# Patient Record
Sex: Male | Born: 1990 | Race: White | Hispanic: No | Marital: Single
Health system: Midwestern US, Academic
[De-identification: ages and names within clinical notes are randomized; demographics above are authoritative.]

---

## 2017-08-11 ENCOUNTER — Emergency Department: Admit: 2017-08-11 | Discharge: 2017-08-11 | Attending: Primary Care

## 2017-08-11 ENCOUNTER — Emergency Department
Admit: 2017-08-11 | Discharge: 2017-08-11 | Disposition: A | Discharge status: Home / Self Care | Attending: Primary Care

## 2017-08-11 DIAGNOSIS — R0789 Other chest pain: ICD-10-CM

## 2017-08-11 DIAGNOSIS — M940 Chondrocostal junction syndrome [Tietze]: Principal | ICD-10-CM

## 2017-08-11 DIAGNOSIS — K219 Gastro-esophageal reflux disease without esophagitis: ICD-10-CM

## 2017-08-11 LAB — POC TROPONIN
Lab: 0 ng/mL (ref 0.00–0.05)
Lab: 0 ng/mL (ref 0.00–0.05)

## 2017-08-11 LAB — URINALYSIS, MICROSCOPIC

## 2017-08-11 LAB — CBC AND DIFF: Lab: 7.7 10*3/uL (ref 4.5–11.0)

## 2017-08-11 LAB — COMPREHENSIVE METABOLIC PANEL: Lab: 139 MMOL/L (ref 137–147)

## 2017-08-11 LAB — URINALYSIS DIPSTICK
Lab: NEGATIVE g/dL (ref 3.5–5.0)
Lab: NEGATIVE mg/dL (ref 0.3–1.2)

## 2017-08-11 LAB — LIPASE: Lab: 19 U/L (ref 11–82)

## 2017-08-11 MED ORDER — KETOROLAC 15 MG/ML IJ SOLN
15 mg | Freq: Once | INTRAVENOUS | 0 refills | Status: CP
Start: 2017-08-11 — End: ?
  Administered 2017-08-11: 17:00:00 15 mg via INTRAVENOUS

## 2017-08-11 MED ORDER — METHOCARBAMOL 500 MG PO TAB
1000 mg | Freq: Once | ORAL | 0 refills | Status: CP
Start: 2017-08-11 — End: ?
  Administered 2017-08-11: 18:00:00 1000 mg via ORAL

## 2017-08-11 MED ORDER — FAMOTIDINE (PF) 20 MG/2 ML IV SOLN
20 mg | Freq: Once | INTRAVENOUS | 0 refills | Status: CP
Start: 2017-08-11 — End: ?
  Administered 2017-08-11: 17:00:00 20 mg via INTRAVENOUS

## 2017-08-11 MED ORDER — PREDNISONE 20 MG PO TAB
ORAL_TABLET | 0 refills | Status: AC
Start: 2017-08-11 — End: 2018-01-06

## 2017-08-11 MED ORDER — SODIUM CHLORIDE 0.9 % IV SOLP
1000 mL | INTRAVENOUS | 0 refills | Status: CP
Start: 2017-08-11 — End: ?
  Administered 2017-08-11: 16:00:00 1000 mL via INTRAVENOUS

## 2017-08-11 MED ORDER — HYDROCODONE-ACETAMINOPHEN 5-325 MG PO TAB
2 | Freq: Once | ORAL | 0 refills | Status: CP
Start: 2017-08-11 — End: ?
  Administered 2017-08-11: 18:00:00 2 via ORAL

## 2017-08-11 MED ORDER — CYCLOBENZAPRINE 10 MG PO TAB
10 mg | ORAL_TABLET | Freq: Three times a day (TID) | ORAL | 0 refills | 21.00000 days | Status: AC | PRN
Start: 2017-08-11 — End: 2018-01-06

## 2017-08-11 MED ORDER — IMS MIXTURE TEMPLATE
50 mg | Freq: Once | ORAL | 0 refills | Status: CP
Start: 2017-08-11 — End: ?
  Administered 2017-08-11 (×2): 50 mg via ORAL

## 2017-08-11 MED ORDER — OMEPRAZOLE 20 MG PO TBEC
20 mg | ORAL_TABLET | Freq: Every day | ORAL | 0 refills | Status: AC
Start: 2017-08-11 — End: 2018-01-06

## 2017-08-11 MED ORDER — OMEPRAZOLE 20 MG PO TBEC
20 mg | ORAL_TABLET | Freq: Every day | ORAL | 5 refills | Status: AC
Start: 2017-08-11 — End: 2017-08-11

## 2017-08-11 MED ORDER — ACETAMINOPHEN/LIDOCAINE/ANTACID DS(#) 1:1:3  PO SUSP
30 mL | Freq: Once | ORAL | 0 refills | Status: CP
Start: 2017-08-11 — End: ?
  Administered 2017-08-11: 16:00:00 30 mL via ORAL

## 2017-08-11 MED ORDER — NAPROXEN 500 MG PO TAB
500 mg | ORAL_TABLET | Freq: Two times a day (BID) | ORAL | 0 refills | Status: AC
Start: 2017-08-11 — End: 2018-02-04

## 2017-09-24 ENCOUNTER — Encounter: Admit: 2017-09-24 | Discharge: 2017-09-25

## 2017-12-04 ENCOUNTER — Encounter: Admit: 2017-12-04 | Discharge: 2017-12-04 | Payer: Commercial Managed Care - PPO

## 2017-12-05 ENCOUNTER — Encounter: Admit: 2017-12-05 | Discharge: 2017-12-05 | Payer: PRIVATE HEALTH INSURANCE

## 2017-12-10 ENCOUNTER — Encounter: Admit: 2017-12-10 | Discharge: 2017-12-10 | Payer: Commercial Managed Care - PPO

## 2017-12-11 ENCOUNTER — Ambulatory Visit: Admit: 2017-12-10 | Discharge: 2017-12-11 | Payer: PRIVATE HEALTH INSURANCE

## 2017-12-11 DIAGNOSIS — K4090 Unilateral inguinal hernia, without obstruction or gangrene, not specified as recurrent: Principal | ICD-10-CM

## 2018-01-06 ENCOUNTER — Encounter: Admit: 2018-01-06 | Discharge: 2018-01-06 | Payer: Commercial Managed Care - PPO

## 2018-01-06 ENCOUNTER — Encounter: Admit: 2018-01-06 | Discharge: 2018-01-06 | Payer: PRIVATE HEALTH INSURANCE

## 2018-01-06 DIAGNOSIS — K4090 Unilateral inguinal hernia, without obstruction or gangrene, not specified as recurrent: Principal | ICD-10-CM

## 2018-01-06 MED ORDER — CEFAZOLIN INJ 1GM IVP
2 g | Freq: Once | INTRAVENOUS | 0 refills | Status: CN
Start: 2018-01-06 — End: ?

## 2018-01-09 ENCOUNTER — Encounter: Admit: 2018-01-09 | Discharge: 2018-01-09 | Payer: PRIVATE HEALTH INSURANCE

## 2018-01-09 ENCOUNTER — Ambulatory Visit: Admit: 2018-01-09 | Discharge: 2018-01-09 | Payer: Commercial Managed Care - PPO

## 2018-01-09 ENCOUNTER — Encounter: Admit: 2018-01-09 | Discharge: 2018-01-09 | Payer: Commercial Managed Care - PPO

## 2018-01-09 ENCOUNTER — Ambulatory Visit: Admit: 2018-01-09 | Discharge: 2018-01-09

## 2018-01-09 ENCOUNTER — Ambulatory Visit: Admit: 2018-01-09 | Discharge: 2018-01-09 | Payer: PRIVATE HEALTH INSURANCE

## 2018-01-09 DIAGNOSIS — K4090 Unilateral inguinal hernia, without obstruction or gangrene, not specified as recurrent: Principal | ICD-10-CM

## 2018-01-09 MED ORDER — OXYCODONE 5 MG PO TAB
5-10 mg | Freq: Once | ORAL | 0 refills | Status: CP | PRN
Start: 2018-01-09 — End: ?
  Administered 2018-01-09: 17:00:00 5 mg via ORAL

## 2018-01-09 MED ORDER — LIDOCAINE HCL 10 MG/ML (1 %) IJ SOLN
0 refills | Status: DC
Start: 2018-01-09 — End: 2018-01-09
  Administered 2018-01-09: 16:00:00 5 mL via INTRAMUSCULAR

## 2018-01-09 MED ORDER — DIPHENHYDRAMINE HCL 50 MG/ML IJ SOLN
25 mg | Freq: Once | INTRAVENOUS | 0 refills | Status: DC | PRN
Start: 2018-01-09 — End: 2018-01-09

## 2018-01-09 MED ORDER — DEXAMETHASONE SODIUM PHOSPHATE 4 MG/ML IJ SOLN
INTRAVENOUS | 0 refills | Status: DC
Start: 2018-01-09 — End: 2018-01-09
  Administered 2018-01-09: 16:00:00 4 mg via INTRAVENOUS

## 2018-01-09 MED ORDER — CEFAZOLIN INJ 1GM IVP
2 g | Freq: Once | INTRAVENOUS | 0 refills | Status: CP
Start: 2018-01-09 — End: ?
  Administered 2018-01-09: 16:00:00 2 g via INTRAVENOUS

## 2018-01-09 MED ORDER — PROPOFOL INJ 10 MG/ML IV VIAL
0 refills | Status: DC
Start: 2018-01-09 — End: 2018-01-09
  Administered 2018-01-09: 16:00:00 200 mg via INTRAVENOUS

## 2018-01-09 MED ORDER — LACTATED RINGERS IV SOLP
INTRAVENOUS | 0 refills | Status: DC
Start: 2018-01-09 — End: 2018-01-09
  Administered 2018-01-09: 15:00:00 1000.000 mL via INTRAVENOUS

## 2018-01-09 MED ORDER — ONDANSETRON HCL (PF) 4 MG/2 ML IJ SOLN
INTRAVENOUS | 0 refills | Status: DC
Start: 2018-01-09 — End: 2018-01-09
  Administered 2018-01-09: 16:00:00 4 mg via INTRAVENOUS

## 2018-01-09 MED ORDER — MEPERIDINE (PF) 25 MG/ML IJ SYRG
12.5 mg | INTRAVENOUS | 0 refills | Status: DC | PRN
Start: 2018-01-09 — End: 2018-01-09
  Administered 2018-01-09: 17:00:00 12.5 mg via INTRAVENOUS

## 2018-01-09 MED ORDER — LIDOCAINE (PF) 10 MG/ML (1 %) IJ SOLN
.1-2 mL | INTRAMUSCULAR | 0 refills | Status: DC | PRN
Start: 2018-01-09 — End: 2018-01-09

## 2018-01-09 MED ORDER — BUPIVACAINE 0.25 % (2.5 MG/ML) IJ SOLN
0 refills | Status: DC
Start: 2018-01-09 — End: 2018-01-09
  Administered 2018-01-09: 16:00:00 5 mL via INTRAMUSCULAR

## 2018-01-09 MED ORDER — ONDANSETRON HCL (PF) 4 MG/2 ML IJ SOLN
4 mg | Freq: Once | INTRAVENOUS | 0 refills | Status: DC | PRN
Start: 2018-01-09 — End: 2018-01-09

## 2018-01-09 MED ORDER — FENTANYL CITRATE (PF) 50 MCG/ML IJ SOLN
50 ug | INTRAVENOUS | 0 refills | Status: DC | PRN
Start: 2018-01-09 — End: 2018-01-09
  Administered 2018-01-09: 17:00:00 50 ug via INTRAVENOUS

## 2018-01-09 MED ORDER — HYDROCODONE-ACETAMINOPHEN 5-325 MG PO TAB
1 | ORAL_TABLET | ORAL | 0 refills | 15.00000 days | Status: AC | PRN
Start: 2018-01-09 — End: 2018-01-21
  Filled 2018-01-09 (×2): qty 30, 5d supply, fill #1

## 2018-01-09 MED ORDER — LIDOCAINE (PF) 200 MG/10 ML (2 %) IJ SYRG
0 refills | Status: DC
Start: 2018-01-09 — End: 2018-01-09
  Administered 2018-01-09: 16:00:00 60 mg via INTRAVENOUS

## 2018-01-09 MED ORDER — HALOPERIDOL LACTATE 5 MG/ML IJ SOLN
1 mg | Freq: Once | INTRAVENOUS | 0 refills | Status: DC | PRN
Start: 2018-01-09 — End: 2018-01-09

## 2018-01-09 MED ORDER — FENTANYL CITRATE (PF) 50 MCG/ML IJ SOLN
0 refills | Status: DC
Start: 2018-01-09 — End: 2018-01-09
  Administered 2018-01-09: 16:00:00 100 ug via INTRAVENOUS

## 2018-01-09 MED ORDER — FENTANYL CITRATE (PF) 50 MCG/ML IJ SOLN
25 ug | INTRAVENOUS | 0 refills | Status: DC | PRN
Start: 2018-01-09 — End: 2018-01-09

## 2018-01-09 MED ORDER — MIDAZOLAM 1 MG/ML IJ SOLN
INTRAVENOUS | 0 refills | Status: DC
Start: 2018-01-09 — End: 2018-01-09
  Administered 2018-01-09: 16:00:00 2 mg via INTRAVENOUS

## 2018-01-11 ENCOUNTER — Encounter: Admit: 2018-01-11 | Discharge: 2018-01-11

## 2018-01-11 DIAGNOSIS — K4090 Unilateral inguinal hernia, without obstruction or gangrene, not specified as recurrent: Principal | ICD-10-CM

## 2018-01-21 ENCOUNTER — Encounter: Admit: 2018-01-21 | Discharge: 2018-01-21

## 2018-01-21 ENCOUNTER — Ambulatory Visit: Admit: 2018-01-21 | Discharge: 2018-01-22

## 2018-01-21 DIAGNOSIS — K4090 Unilateral inguinal hernia, without obstruction or gangrene, not specified as recurrent: Principal | ICD-10-CM

## 2018-01-21 MED ORDER — HYDROCODONE-ACETAMINOPHEN 5-325 MG PO TAB
1 | ORAL_TABLET | ORAL | 0 refills | 30.00000 days | Status: AC | PRN
Start: 2018-01-21 — End: 2018-02-04

## 2018-01-22 DIAGNOSIS — K4090 Unilateral inguinal hernia, without obstruction or gangrene, not specified as recurrent: Principal | ICD-10-CM

## 2018-02-04 ENCOUNTER — Encounter: Admit: 2018-02-04 | Discharge: 2018-02-04

## 2018-02-04 ENCOUNTER — Ambulatory Visit: Admit: 2018-02-04 | Discharge: 2018-02-05

## 2018-02-04 DIAGNOSIS — K4090 Unilateral inguinal hernia, without obstruction or gangrene, not specified as recurrent: Principal | ICD-10-CM

## 2018-02-05 DIAGNOSIS — K4090 Unilateral inguinal hernia, without obstruction or gangrene, not specified as recurrent: Principal | ICD-10-CM

## 2018-02-18 ENCOUNTER — Ambulatory Visit: Admit: 2018-02-18 | Discharge: 2018-02-19

## 2018-02-18 ENCOUNTER — Encounter: Admit: 2018-02-18 | Discharge: 2018-02-18

## 2018-02-18 DIAGNOSIS — K4090 Unilateral inguinal hernia, without obstruction or gangrene, not specified as recurrent: Principal | ICD-10-CM

## 2018-02-19 DIAGNOSIS — K4090 Unilateral inguinal hernia, without obstruction or gangrene, not specified as recurrent: Principal | ICD-10-CM

## 2018-06-26 ENCOUNTER — Inpatient Hospital Stay
Admit: 2018-06-26 | Discharge: 2018-06-27 | Disposition: A | Discharge status: Home / Self Care | Attending: Critical Care Medicine

## 2018-06-26 ENCOUNTER — Encounter: Admit: 2018-06-26 | Discharge: 2018-06-26

## 2018-06-26 ENCOUNTER — Emergency Department: Admit: 2018-06-26 | Discharge: 2018-06-27 | Attending: Critical Care Medicine

## 2018-06-26 ENCOUNTER — Encounter: Admit: 2018-06-26 | Discharge: 2018-06-26 | Payer: 59

## 2018-06-26 LAB — BASIC METABOLIC PANEL
Lab: 1.2 mg/dL (ref 0.4–1.24)
Lab: 16 mg/dL (ref 7–25)
Lab: 37 % — ABNORMAL HIGH (ref 3–12)
Lab: 9.7 mg/dL (ref 8.5–10.6)
Lab: >60 mL/min (ref >60)
Lab: >60 mL/min (ref >60)
Lab: 0.9 mg/dL (ref 0.4–1.24)
Lab: 109 MMOL/L (ref 98–110)
Lab: 139 MMOL/L (ref >60)
Lab: 139 mg/dL — ABNORMAL HIGH (ref 70–100)
Lab: 15 mg/dL (ref 7–25)
Lab: 19 MMOL/L — ABNORMAL LOW (ref 21–30)
Lab: 8.3 mg/dL — ABNORMAL LOW (ref 8.5–10.6)
Lab: >60 mL/min (ref >60)
Lab: >60 mL/min (ref >60)
Lab: 11 (ref 3–12)

## 2018-06-26 LAB — OPIATES-URINE RANDOM: Lab: NEGATIVE

## 2018-06-26 LAB — AMPHETAMINES-URINE RANDOM: Lab: NEGATIVE

## 2018-06-26 LAB — CREATINE KINASE-CPK: Lab: 136 U/L (ref 35–232)

## 2018-06-26 LAB — PHENCYCLIDINES-URINE RANDOM: Lab: NEGATIVE

## 2018-06-26 LAB — BARBITURATES-URINE RANDOM: Lab: NEGATIVE

## 2018-06-26 LAB — CBC: Lab: 15 10*3/uL — ABNORMAL HIGH (ref 4.5–11.0)

## 2018-06-26 LAB — COCAINE-URINE RANDOM: Lab: NEGATIVE

## 2018-06-26 LAB — CANNABINOIDS-URINE RANDOM: Lab: POSITIVE — AB

## 2018-06-26 LAB — BENZODIAZEPINES-URINE RANDOM: Lab: NEGATIVE

## 2018-06-26 LAB — PTT (APTT): Lab: 38 s — ABNORMAL HIGH (ref 24.0–36.5)

## 2018-06-26 LAB — PROTIME INR (PT): Lab: 1.1 M/UL (ref 0.8–1.2)

## 2018-06-26 LAB — ALCOHOL LEVEL: Lab: <10 mg/dL — CL (ref 21–30)

## 2018-06-26 MED ORDER — ACETAMINOPHEN 325 MG PO TAB
650 mg | ORAL | 0 refills | Status: DC | PRN
Start: 2018-06-26 — End: 2018-06-27
  Administered 2018-06-27 (×2): 650 mg via ORAL

## 2018-06-26 MED ORDER — POTASSIUM CHLORIDE IN WATER 10 MEQ/50 ML IV PGBK
10 meq | INTRAVENOUS | 0 refills | Status: DC
Start: 2018-06-26 — End: 2018-06-26
  Administered 2018-06-26: 19:00:00 10 meq via INTRAVENOUS

## 2018-06-26 MED ORDER — POTASSIUM CHLORIDE IN WATER 10 MEQ/50 ML IV PGBK
10 meq | INTRAVENOUS | 0 refills | Status: DC
Start: 2018-06-26 — End: 2018-06-26

## 2018-06-26 MED ORDER — SODIUM CHLORIDE 0.9 % IV SOLP
1000 mL | INTRAVENOUS | 0 refills | Status: CP
Start: 2018-06-26 — End: ?
  Administered 2018-06-26: 18:00:00 1000 mL via INTRAVENOUS

## 2018-06-26 MED ORDER — POTASSIUM CHLORIDE 20 MEQ PO TBTQ
40 meq | Freq: Once | ORAL | 0 refills | Status: DC
Start: 2018-06-26 — End: 2018-06-26

## 2018-06-26 MED ORDER — SODIUM CHLORIDE 0.9 % IJ SOLN
50 mL | Freq: Once | INTRAVENOUS | 0 refills | Status: CP
Start: 2018-06-26 — End: ?
  Administered 2018-06-26: 18:00:00 50 mL via INTRAVENOUS

## 2018-06-26 MED ORDER — IOHEXOL 350 MG IODINE/ML IV SOLN
100 mL | Freq: Once | INTRAVENOUS | 0 refills | Status: CP
Start: 2018-06-26 — End: ?
  Administered 2018-06-26: 18:00:00 100 mL via INTRAVENOUS

## 2018-06-26 MED ORDER — ALUMINUM-MAGNESIUM HYDROXIDE 200-200 MG/5 ML PO SUSP
30 mL | Freq: Once | ORAL | 0 refills | Status: DC
Start: 2018-06-26 — End: 2018-06-27

## 2018-06-26 MED ORDER — ONDANSETRON HCL (PF) 4 MG/2 ML IJ SOLN
4 mg | INTRAVENOUS | 0 refills | Status: DC | PRN
Start: 2018-06-26 — End: 2018-06-27
  Administered 2018-06-27: 02:00:00 4 mg via INTRAVENOUS

## 2018-06-26 NOTE — H&P (View-Only)
Trauma History and Physical Examination   06/26/2018     Admission Date:  06/26/2018   __________________________________________________________________________________   HPI: 52M activated as a type 2 trauma s/p unwitnessed fall with possible seizure activity. His brother noted him going into the bathroom and then coming out with bruises on face that were not previously present. Brother then said he witnessed generalized seizure activity. GSC 10 on arrival by EMS.   PMH: unable to assess, patient would not answer  PSH: unable to assess, patient would not answer  FHx: unable to assess, patient would not answer  Meds:unable to assess, patient would not answer  Immunizations (includes history and patient reported): unknown  Allergies: unable to assess, patient would not answer  SH: unable to assess, patient would not answer  ROS: A comprehensive 12 point review of systems was obtained and is negative except for as noted in the HPI.    Objective:         PE:   Primary survey:   Airway patent to voice, trachea midline  Breath sounds equal bilaterally, equal chest rise   Carotid and femoral pulses palpable bilaterally, heart sounds clear  GCS 13 ([id number])  5/5 strength/sensation in bilateral upper and lower extremities   Fast scan negative X 4 quadrants    Secondary Survey  HEENT: head normocephalic, PERRL, tympanic membranes intact, no blood in ears/nose/mouth, no midface instability, 2 contusions on forehead and right eye  Neck: C-collar in place  Chest: No chest wall instability   Abdomen: Soft, non-tender, non-distended, no masses, no rebound, no guarding   Pelvis: No obvious instability   Back: No CTL tenderness to palpation, no stepoff, no deformity   Rectal: Normal rectal tone, no blood   Extremities: No long bone deformities, lacerations, or abrasions. Palpable DP and PT pulses bilaterally    A/P:  52M s/p unwitnessed fall with generalized seizure activity    - CT head, c-spine, max/face, CAP  - UDS

## 2018-06-27 ENCOUNTER — Emergency Department: Admit: 2018-06-26 | Discharge: 2018-06-26 | Attending: Critical Care Medicine

## 2018-06-27 DIAGNOSIS — F13239 Sedative, hypnotic or anxiolytic dependence with withdrawal, unspecified: Principal | ICD-10-CM

## 2018-06-27 DIAGNOSIS — R569 Unspecified convulsions: ICD-10-CM

## 2018-06-27 DIAGNOSIS — G8911 Acute pain due to trauma: ICD-10-CM

## 2018-06-27 DIAGNOSIS — S0083XA Contusion of other part of head, initial encounter: ICD-10-CM

## 2018-06-27 DIAGNOSIS — E872 Acidosis: ICD-10-CM

## 2018-06-27 DIAGNOSIS — E876 Hypokalemia: ICD-10-CM

## 2018-06-27 DIAGNOSIS — S60221A Contusion of right hand, initial encounter: ICD-10-CM

## 2018-06-27 DIAGNOSIS — F419 Anxiety disorder, unspecified: ICD-10-CM

## 2018-06-27 LAB — LACTIC ACID(LACTATE): Lab: 0.7 MMOL/L (ref 0.5–2.0)

## 2018-06-27 LAB — CBC
Lab: 13 % (ref >60)
Lab: 166 10*3/uL (ref >60)

## 2018-06-27 MED ORDER — ACETAMINOPHEN 325 MG PO TAB
650 mg | ORAL | 0 refills | Status: AC | PRN
Start: 2018-06-27 — End: ?

## 2018-06-27 NOTE — Progress Notes
PHYSICAL THERAPY  NOTE      Name: Alexis Le        MRN: [id number]          DOB: 22-Aug-1990          Age: 28 y.o.  Admission Date: 06/26/2018             LOS: 1 day      Patient denies changes from baseline mobility. Patient endorses no concerns with mobility at home. Currently, the patient is ambulating without difficulty.       Encouraged patient to continue mobilization while in the hospital and discussed the mobility plan with the bedside nursing staff.  Physical therapy services will be discontinued at this time, please re-consult if the patient has a change in mobility status.          Therapist: Hinda Lenis, PT  Date: 06/27/2018

## 2018-06-27 NOTE — H&P (View-Only)
Finalized by Caro Hight, M.D. on 06/26/2018 1:42 PM. Dictated by Norva Pavlov, MD on 06/26/2018 1:02 PM.         CT ABD/PELV W CONTRAST   Final Result         CHEST:   1.  No major thoracic injury status post trauma.       ABDOMEN AND PELVIS:   1.  No major abdominopelvic visceral injury, hemoperitoneum or acute fracture.      2.  Tiny focus of intraluminal gas within the bladder, which may be due to recent instrumentation.      By my electronic signature, I attest that I have personally reviewed the images for this examination and formulated the interpretations and opinions expressed in this report          Finalized by Caro Hight, M.D. on 06/26/2018 1:42 PM. Dictated by Norva Pavlov, MD on 06/26/2018 1:02 PM.         CT HEAD WO CONTRAST   Final Result         Head:       No acute intracranial hemorrhage or calvarial fracture.      Maxillofacial:       1. No evidence of acute facial fracture or orbital hemorrhage.   2. Untreated dental caries.      Cervical Spine:    No evidence of acute cervical fracture or subluxation.      By my electronic signature, I attest that I have personally reviewed the images for this examination and formulated the interpretations and opinions expressed in this report          Finalized by Dimple Casey, M.D. on 06/26/2018 1:19 PM. Dictated by Tilda Burrow, M.D. on 06/26/2018 1:01 PM.         CT MAXIFACIAL/SINUS WO CONTRAST   Final Result         Head:       No acute intracranial hemorrhage or calvarial fracture.      Maxillofacial:       1. No evidence of acute facial fracture or orbital hemorrhage.   2. Untreated dental caries.      Cervical Spine:    No evidence of acute cervical fracture or subluxation.      By my electronic signature, I attest that I have personally reviewed the images for this examination and formulated the interpretations and opinions expressed in this report          Finalized by Dimple Casey, M.D. on 06/26/2018 1:19 PM. Dictated by Leighton Parody

## 2018-06-27 NOTE — Care Plan
Problem: Discharge Planning  Goal: Participation in plan of care  Outcome: Goal Achieved  Goal: Knowledge regarding plan of care  Outcome: Goal Achieved  Goal: Prepared for discharge  Outcome: Goal Achieved     Problem: Infection, Risk of  Goal: Absence of infection  Outcome: Goal Achieved  Goal: Knowledge of Infection Control Procedures  Outcome: Goal Achieved

## 2018-06-27 NOTE — Progress Notes
Patient's father brought pts cell phone to front desk. Patient now has it as bedside.

## 2018-06-27 NOTE — Discharge Instructions - Pharmacy
opinions expressed in this report          Finalized by Dimple Casey, M.D. on 06/26/2018 1:19 PM. Dictated by Tilda Burrow, M.D. on 06/26/2018 1:01 PM.         CT SPINE CERVICAL WO CONTRAST   Final Result         Head:       No acute intracranial hemorrhage or calvarial fracture.      Maxillofacial:       1. No evidence of acute facial fracture or orbital hemorrhage.   2. Untreated dental caries.      Cervical Spine:    No evidence of acute cervical fracture or subluxation.      By my electronic signature, I attest that I have personally reviewed the images for this examination and formulated the interpretations and opinions expressed in this report          Finalized by Dimple Casey, M.D. on 06/26/2018 1:19 PM. Dictated by Tilda Burrow, M.D. on 06/26/2018 1:01 PM.         POC TRAUMA ACTIVATION FAST EXAM    (Results Pending)     Results for Alexis Le, Alexis Le (MRN [id number]) as of 06/27/2018 12:13   Ref. Range 06/26/2018 12:22   Hemoglobin Latest Ref Range: 13.5 - 16.5 GM/DL 45.4   Hematocrit Latest Ref Range: 40 - 50 % 48.3   Platelet Count Latest Ref Range: 150 - 400 K/UL 243   White Blood Cells Latest Ref Range: 4.5 - 11.0 K/UL 15.0 (H)   RBC Latest Ref Range: 4.4 - 5.5 M/UL 4.99   MCV Latest Ref Range: 80 - 100 FL 96.7   MCH Latest Ref Range: 26 - 34 PG 31.3   MCHC Latest Ref Range: 32.0 - 36.0 G/DL 09.8   MPV Latest Ref Range: 7 - 11 FL 10.4   RDW Latest Ref Range: 11 - 15 % 13.8   INR Latest Ref Range: 0.8 - 1.2  1.1   APTT Latest Ref Range: 24.0 - 36.5 SEC 38.0 (H)   ACT Rapid Latest Ref Range: 91 - 143 s 97   Angle Rapid Latest Ref Range: 67 - 82 DEG 64.5 (L)   MA Rapid Latest Ref Range: 58 - 78 MM 61.5   LYSIS30 Rapid Latest Ref Range: 0 - 4 % 0.4   Sodium Latest Ref Range: 137 - 147 MMOL/L 144   Potassium Latest Ref Range: 3.5 - 5.1 MMOL/L 2.9 (L)   Chloride Latest Ref Range: 98 - 110 MMOL/L 102   CO2 Latest Ref Range: 21 - 30 MMOL/L 5 (LL)   Anion Gap Latest Ref Range: 3 - 12  37 (H) to the chair and walking daily to return to your normal activity level. Begin to work toward your normal activity level at discharge     Return Appointment    You may follow-up in the [facility] on an as needed basis. Please call [phone] if you would like to schedule an appointment or with any questions or concerns.     Driving Restrictions    No driving for 6 months following seizure. You must be cleared by your primary doctor prior to driving again.     Work/School Restrictions    No driving or operating heavy machinery for 6 months     Other Information    DO NOT DO DRUGS.   No driving or operating machinery for 6 months. Do not lock the door when you are  showering.  Get plenty of sleep. Avoid drugs and alcohol.      Current Discharge Medication List       START taking these medications    Details   acetaminophen (TYLENOL) 325 mg tablet Take two tablets by mouth every 4 hours as needed.    PRESCRIPTION TYPE:  OTC          CONTINUE these medications which have NOT CHANGED    Details   hydrocortisone 1 % topical cream Apply  topically to affected area daily. Indications: eczema    PRESCRIPTION TYPE:  Historical Med              Pending items needing follow up: None    Signed:  Prescott Parma, APRN-NP  06/27/2018      cc:  Primary Care Physician:  Nash Dimmer   Verified  Referring physicians:     Additional provider(s):

## 2020-01-20 ENCOUNTER — Encounter: Admit: 2020-01-20 | Discharge: 2020-01-20

## 2020-01-20 NOTE — Telephone Encounter
I attempted to contact pt for health history The prescriber you dialed is not in service    Monna Fam, RN      Initial Visit Date: New patient office visit planned for 01/24/20  Barb Merino MD     Reason for Visit:   06/26/18 New on set seizure  His brother witnessed him having tonic clonic seizure activity      Any recent injuries?    Previous Diagnosis of Epilepsy?     If Yes? Then by who and at what age?      Physician Information  PCP:   Referring Physician:   Previous Neurologist:    Other providers seen:      Previous Imaging/Procedures:  06/26/19 CT head [place]  : ?No acute intracranial hemorrhage or calvarial fracture.         Upcoming [facility] Appointments:     Recent ED/Hospitalizations:   06/26/18 ED [place]  : Seizure      Current Meds:     Current Allergies:   NKA    Past Tried and Failed AED:

## 2020-02-24 ENCOUNTER — Encounter: Admit: 2020-02-24 | Discharge: 2020-02-24

## 2020-02-24 NOTE — Telephone Encounter
Witten Certain  Mar 27, 1990    MEDICAL RECORDS REQUEST  -  [facility]  Fax [phone]     Request for the following medical records for purpose of continuity of care:        Please send:   Hospital Discharge Summary from 01/22/20   CT head report if available  EEG report if available.     Please fax to [phone]   attn [address] RN / Winona Legato MD        Thank you    Monna Fam RN  [facility]

## 2020-03-13 ENCOUNTER — Encounter: Admit: 2020-03-13 | Discharge: 2020-03-13

## 2020-03-13 ENCOUNTER — Ambulatory Visit: Admit: 2020-03-13 | Discharge: 2020-03-14

## 2020-03-13 DIAGNOSIS — K4090 Unilateral inguinal hernia, without obstruction or gangrene, not specified as recurrent: Secondary | ICD-10-CM

## 2020-03-13 MED ORDER — DIVALPROEX 500 MG PO TB24
500 mg | ORAL_TABLET | Freq: Every evening | ORAL | 1 refills | 30.00000 days | Status: AC
Start: 2020-03-13 — End: ?

## 2020-03-14 DIAGNOSIS — G40909 Epilepsy, unspecified, not intractable, without status epilepticus: Secondary | ICD-10-CM

## 2020-04-10 ENCOUNTER — Encounter: Admit: 2020-04-10 | Discharge: 2020-04-10

## 2020-11-03 ENCOUNTER — Emergency Department: Admit: 2020-11-03 | Discharge: 2020-11-03 | Payer: BC Managed Care – PPO

## 2020-11-03 ENCOUNTER — Encounter: Admit: 2020-11-03 | Discharge: 2020-11-03 | Payer: BC Managed Care – PPO

## 2020-11-03 DIAGNOSIS — R569 Unspecified convulsions: Secondary | ICD-10-CM

## 2020-11-03 LAB — COMPREHENSIVE METABOLIC PANEL
BLD UREA NITROGEN: 16 mg/dL (ref 7–25)
CHLORIDE: 104 MMOL/L (ref 98–110)
CREATININE: 0.8 mg/dL (ref 0.4–1.24)
GLUCOSE,PANEL: 109 mg/dL — ABNORMAL HIGH (ref 70–100)
POTASSIUM: 3.9 MMOL/L (ref 3.5–5.1)
SODIUM: 139 MMOL/L (ref 137–147)

## 2020-11-03 LAB — CANNABINOIDS-URINE RANDOM: CANNABINOIDS (THC): POSITIVE — AB

## 2020-11-03 LAB — URINALYSIS MICROSCOPIC REFLEX TO CULTURE

## 2020-11-03 LAB — POC GLUCOSE: POC GLUCOSE: 118 mg/dL — ABNORMAL HIGH (ref 70–100)

## 2020-11-03 LAB — COCAINE-URINE RANDOM: COCAINE: NEGATIVE

## 2020-11-03 LAB — AMPHETAMINES-URINE RANDOM: AMPHETAMINES: NEGATIVE (ref 5.0–8.0)

## 2020-11-03 LAB — CREATINE KINASE-CPK: CK TOTAL: 136 U/L (ref 35–232)

## 2020-11-03 LAB — FENTANYL URINE: FENTANYL URINE: NEGATIVE

## 2020-11-03 LAB — BARBITURATES-URINE RANDOM: BARBITURATES: NEGATIVE — AB

## 2020-11-03 LAB — URINALYSIS DIPSTICK REFLEX TO CULTURE

## 2020-11-03 LAB — CBC AND DIFF: WBC COUNT: 8.2 K/UL (ref 4.5–11.0)

## 2020-11-03 LAB — BENZODIAZEPINES-URINE RANDOM: BENZODIAZEPINES: NEGATIVE

## 2020-11-03 LAB — OPIATES-URINE RANDOM: OPIATES: NEGATIVE

## 2020-11-03 LAB — PHENCYCLIDINES-URINE RANDOM: PHENCYCLIDINE (PCP): NEGATIVE

## 2020-11-03 MED ORDER — VALPROIC ACID IVPB
20 mg/kg | Freq: Once | INTRAVENOUS | 0 refills | Status: CP
Start: 2020-11-03 — End: ?
  Administered 2020-11-03 (×2): 1224 mg via INTRAVENOUS

## 2020-11-03 MED ORDER — CLORAZEPATE DIPOTASSIUM 3.75 MG PO TAB
3.75 mg | Freq: Once | ORAL | 0 refills | Status: CP
Start: 2020-11-03 — End: ?
  Administered 2020-11-03: 21:00:00 3.75 mg via ORAL

## 2020-11-03 MED ORDER — DIVALPROEX 500 MG PO TB24
500 mg | ORAL_TABLET | Freq: Two times a day (BID) | ORAL | 0 refills | 30.00000 days | Status: AC
Start: 2020-11-03 — End: ?

## 2020-11-03 NOTE — ED Notes
1258 - friend reports pt had a seizure. Pt diaphoretic and pale. Pt asking "what happened?" Pt denies feeling seizure coming on. MD notified.     63 - friend at bedside reports pt had another seizure described as pt having tremors and going unresponsive for less than a minute. Pt diaphoretic and pale. MD notified.

## 2020-11-03 NOTE — Consults
Neurology Consult/H&P Note      Admission Date: 11/03/2020                                                LOS: 0 days    Reason for Consult:  Seizure    Consult type: Opinion with orders    Assessment: 30 y.o. male with h/o inguinal hernia repair, THC use, and seizures, in the ED for several seizures today. Neurology consulted for the same.     Exam: Non-focal. No oral trauma. AAOx4  Labs:  CBC, CMP - unremarkable  Glu 118  UA - neg  UDS- pending   Imaging:  CT head 03/2018: unremarkable     CXR- neg     Problem List:  1. 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 + post ictal phase        Recommendation:  - UA, UDS, CXR, CPK  - Will load with VPA 20mg /kg x 1  - Tranxene 3.75mg  x 1 (ordered)  - Start VPA ER 500mg  BID- please provide 30d Rx  - Seizure precautions d/w patient: Seizure education:  no driving, no swimming alone, no bathing alone, no ladders, no power tools,  operation of heavy machinery, care of small children without assistance. This is for at least 6 months post last event reported.   - Advised pt to consent to admission to monitor and obtain MRI and EEG, he defers. Able to relay the risks of leaving and having further seizures   - Will arrange FU in epilepsy clinic      The patient was evaluated for /an acute change in neurologic status/an illness that may pose threat to life or function/a chronic illness with exacerbation or progression. I reviewed lab data, personally reviewed neuroimaging (CT). Case was discussed with ED team.      Neurology Swing Consult Service:  > I can be contacted for patient questions on Voalte between 10AM and 11PM.  > Urgent questions (prior to 10AM) can otherwise be directed to the Neurology teaching service at pager [phone].   > Calls to the Neurology swing pager [phone]), will not be returned prior to 12PM     [name], DO  Clinical Associate Professor  Department of Neurology     ______________________________________________________________________    History of Present Illness: Alexis Le is a 30 y.o. male with h/o inguinal hernia repair, THC use, and seizures, in the ED for several seizures today. Neurology consulted for the same.     Was seen in Epilepsy clinic for 2 past seizures- one while coming off xanax and one preceded by sleep deprivation. Had also described events of deja-vu concerning for aura. VPA was ordered along with MRI and EEG. Pt did not start AED and did not have diagnostics completed. Says he was scared to be on AED, thought it would have similar deleterious effects as xanax did in the past.    Pt reports spells are preceded often by an odd feeling and sweating with vomiting. Then is told he stiffens all over. This lasts for up to 5 minutes. Afterwards is confused for 15 minutes. No oral trauma. No  bladder incontinence. After this, he feels anxious and has further sweating. This AM, had seizure at 0400, then went to work and had 2 further events. One in car on the way after brother picked him up from [employer]. Then 2 in the ED. He feels currently at baseline and is sore.     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 Inguinal hernia        Surgical History:   Procedure Laterality Date   ? OPEN RIGHT INGUINAL HERNIA REPAIR WITH MESH Right 01/09/2018    Performed by Janine Ores, DO at [facility]       Social History     Tobacco Use   ? Smoking status: Never Smoker   ? Smokeless tobacco: Never Used   Substance Use Topics   ? Alcohol use: Not Currently   ? Drug use: Yes     Types: Marijuana     Comment: daily       No family history on file.    Allergies:  Patient has no known allergies.    Scheduled Meds:Continuous Infusions:  PRN and Respiratory Meds:    No current facility-administered medications on file prior to encounter.     Current Outpatient Medications on File Prior to Encounter   Medication Sig Dispense Refill   ? acetaminophen (TYLENOL) 325 mg tablet Take two tablets by mouth every 4 hours as needed.     ? divalproex (DEPAKOTE ER) 500 mg ER tablet Take one tablet by mouth at bedtime daily. Take with food. 90 tablet 1   ? hydrocortisone 1 % topical cream Apply  topically to affected area daily. Indications: eczema         Review of Systems:  All other systems reviewed and are negative. + fatigue and myalgia. No CP, SOB, f/c, no nausea, no confusion. + anxiety, no numbness or tingling.     Vital Signs:  Last Filed in 24 hours Vital Signs:  24 hour Range    BP: 112/73 (08/19 1330)  Temp: 36.8 ?C (98.3 ?F) (08/19 1306)  Pulse: 67 (08/19 1330)  Respirations: 17 PER MINUTE (08/19 1332)  SpO2: 96 % (08/19 1330)  SpO2 Pulse: 67 (08/19 1330) BP: (112-120)/(66-73)   Temp:  [36.8 ?C (98.3 ?F)]   Pulse:  [67-76]   Respirations:  [15 PER MINUTE-17 PER MINUTE]   SpO2:  [96 %-98 %]        General physical exam:    HEENT: normocephalic, eyes open with no discharge, nares patent, oropharynx is clear with no lesions, palate intact  CV: regular rate and rhythm  Chest: normal configuration  Ab: soft, non-distended   Skin: no rashes or lesions    Neuro exam:   Mental status: Awake, alert, and oriented to person, place, time and situation    Speech:    Normal Abnormal   Fluency x    Comprehension x    Articulation x    Repetition     Naming x        Cranial Nerves:    Normal Abnormal   II Pupils equal (3mm), round, reactive.     III, IV, VI EOMI, no nystagmus    V Intact to LT in V1-3  VII Face symmetrical, eye closure symmetrical    VIII Hearing intact to finger rub    IX, X Uvula midline and palate elevation symmetrical    XI Shrug equal     XII Tongue midline        Muscle/motor:   Tone: nml  Bulk: nml  Pronator drift: absent     Right  Left   Right  Left    Shoulder abductors:  5  5 Hip flexors:  5  5   Elbow flexors:  5  5 Hip abductors:      Elbow extensors:  5  5 Hip extensors:      Wrist extensors:    Hip adductors:      Wrist flexors:    Knee flexors:      Finger flexors:  5  5 Knee extensors:      Finger extensors:    Ankle plantar flexors:  5  5   Finger abductors:    Ankle dorsiflexors:  5  5   Thumb abductors:    Ankle inversion:         Ankle eversion:         Toe extensors:         Toe flexors:      No abnormal movements, rigidity or spasticity    Reflexes:      Right Left   Triceps     Biceps 2+ 2+   Brachioradialis 2+ 2+   Patella 2+ 2+   Ankle 2+ 2+   Plantar mute mute   Other reflexes:  Hoffman: absent    Sensation:    Normal RUE LUE RLE LLE   Light Touch x       Pin Prick        Temperature x       Vibration x       Proprioception          Coordination:    Normal Abnormal Right Abnormal Left   Finger to Nose x     Rapid alternating       Heel to Shin x     Finger tap x     Foot tap      Other        Gait and Station:  Deferred       Lab/Radiology/Other Diagnostic Tests:  24-hour labs:    Results for orders placed or performed during the hospital encounter of 11/03/20 (from the past 24 hour(s))   POC GLUCOSE    Collection Time: 11/03/20 12:55 PM   Result Value Ref Range    Glucose, POC 118 (H) 70 - 100 MG/DL   CBC AND DIFF    Collection Time: 11/03/20 12:57 PM   Result Value Ref Range    White Blood Cells 8.2 4.5 - 11.0 K/UL    RBC 4.61 4.4 - 5.5 M/UL    Hemoglobin 14.2 13.5 - 16.5 GM/DL    Hematocrit 16.1 40 - 50 %    MCV 90.5 80 - 100 FL    MCH 30.7 26 - 34 PG    MCHC 33.9 32.0 - 36.0 G/DL    RDW 09.6 11 - 15 %    Platelet Count 163 150 - 400 K/UL    MPV 9.9 7 - 11 FL    Neutrophils 71 41 - 77 %    Lymphocytes 20 (L) 24 - 44 %    Monocytes 7 4 - 12 %    Eosinophils 1 0 -  5 %    Basophils 1 0 - 2 %    Absolute Neutrophil Count 5.90 1.8 - 7.0 K/UL Absolute Lymph Count 1.61 1.0 - 4.8 K/UL    Absolute Monocyte Count 0.59 0 - 0.80 K/UL    Absolute Eosinophil Count 0.05 0 - 0.45 K/UL    Absolute Basophil Count 0.04 0 - 0.20 K/UL    MDW (Monocyte Distribution Width) 13.6 <20.7   COMPREHENSIVE METABOLIC PANEL    Collection Time: 11/03/20 12:57 PM   Result Value Ref Range    Sodium 139 137 - 147 MMOL/L    Potassium 3.9 3.5 - 5.1 MMOL/L    Chloride 104 98 - 110 MMOL/L    Glucose 109 (H) 70 - 100 MG/DL    Blood Urea Nitrogen 16 7 - 25 MG/DL    Creatinine 1.61 0.4 - 1.24 MG/DL    Calcium 9.5 8.5 - 09.6 MG/DL    Total Protein 7.0 6.0 - 8.0 G/DL    Total Bilirubin 0.6 0.3 - 1.2 MG/DL    Albumin 4.7 3.5 - 5.0 G/DL    Alk Phosphatase 56 25 - 110 U/L    AST (SGOT) 17 7 - 40 U/L    CO2 27 21 - 30 MMOL/L    ALT (SGPT) 15 7 - 56 U/L    Anion Gap 8 3 - 12    eGFR >60 >60 mL/min   URINALYSIS DIPSTICK REFLEX TO CULTURE    Collection Time: 11/03/20  3:09 PM    Specimen: Urine   Result Value Ref Range    Color,UA YELLOW     Turbidity,UA 1+ (A) CLEAR-CLEAR    Specific Gravity-Urine 1.028 1.005 - 1.030    pH,UA 7.0 5.0 - 8.0    Protein,UA 1+ (A) NEG-NEG    Glucose,UA NEG NEG-NEG    Ketones,UA 1+ (A) NEG-NEG    Bilirubin,UA NEG NEG-NEG    Blood,UA NEG NEG-NEG    Urobilinogen,UA NORMAL NORM-NORMAL    Nitrite,UA NEG NEG-NEG    Leukocytes,UA NEG NEG-NEG    Urine Ascorbic Acid, UA POS (A) NEG-NEG   URINALYSIS MICROSCOPIC REFLEX TO CULTURE    Collection Time: 11/03/20  3:09 PM    Specimen: Urine   Result Value Ref Range    WBCs,UA 0-2 0 - 2 /HPF    RBCs,UA 0-2 0 - 3 /HPF    Comment,UA       Criteria for reflex to culture are WBC>10, Positive Nitrite, and/or >=+1   leukocytes. If quantity is not sufficient, an addendum will follow.      MucousUA 1+     Budding Yeast FEW          Neurology Swing Consult Service:  > I can be contacted for patient questions on Voalte between 10AM and 11PM.    > Urgent questions (prior to 10AM) can otherwise be directed to the Neurology teaching service at pager [phone].     > Calls to the Neurology swing pager [phone]), will not be returned prior to 12PM     [name], DO  Clinical Associate Professor  Department of Neurology

## 2020-11-03 NOTE — ED Notes
Pt A&Ox4 and given discharge instructions and follow up information. Pt verbalizes understanding and has no further questions or complaints. VSS. Pt ambulated out of ED w/ steady gait to go home by POV.

## 2020-11-03 NOTE — ED Provider Notes
Alexis Le is a 30 y.o. male.    Chief Complaint:  Chief Complaint   Patient presents with   ? Seizure     Hx of seizures; x4 today - 1 witnessed       History of Present Illness:  Patient is a 30 year old male with past with history of seizures presenting to emergency department with 4 seizures today.  Patient states that he was at work and believes he had 4 seizures.  The last seizure was witnessed by brother who states that patient had tonic-clonic movements for approximately 3 minutes and was unresponsive during this episode.  Patient then had postictal period for approximately 20 minutes.  Patient has now back to baseline per brother and patient.  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  Even upon quitting benzodiazepines and this job he has continued to have 2 similar seizures to today every week.  He saw neurologist in epilepsy clinic who prescribed patient Depakote that he never began taking.  He also never followed up with neurology due to being nervous about being on long-term medication.  Patient denies any recent illness or fever, chest pain, shortness of breath, tongue laceration, bowel or bladder symptoms, focal neurodeficit.          Review of Systems:  Review of Systems   Constitutional: Negative for fever.   HENT: Negative for sore throat.    Eyes: Negative for visual disturbance.   Respiratory: Negative for shortness of breath.    Cardiovascular: Negative for chest pain.   Gastrointestinal: Negative for abdominal pain.   Genitourinary: Negative for dysuria.   Musculoskeletal: Negative for back pain.   Skin: Negative for rash.   Neurological: Positive for seizures. Negative for headaches.       Allergies:  Patient has no known allergies.    Past Medical History:  Medical History:   Diagnosis Date   ? Inguinal hernia        Past Surgical History:  Surgical History:   Procedure Laterality Date   ? OPEN RIGHT INGUINAL HERNIA REPAIR WITH MESH Right 01/09/2018    Performed by Janine Ores, DO at [facility] OR       Pertinent medical and surgical history reviewed    Social History:  Social History     Tobacco Use   ? Smoking status: Never Smoker   ? Smokeless tobacco: Never Used   Substance Use Topics   ? Alcohol use: Not Currently   ? Drug use: Yes     Types: Marijuana     Comment: daily     Social History     Substance and Sexual Activity   Drug Use Yes   ? Types: Marijuana    Comment: daily             Family History:  No family history on file.    Vitals:  ED Vitals    Date and Time T BP P RR SPO2P SPO2 User   11/03/20 1432 -- -- 61 17 PER MINUTE 60 96 % RC   11/03/20 1430 -- 112/69 -- -- 64 97 % RC   11/03/20 1332 -- -- -- 17 PER MINUTE -- -- NH   11/03/20 1330 -- 112/73 67 -- 67 96 % NH   11/03/20 1306 36.8 ?C (98.3 ?F) -- -- -- -- -- RC   11/03/20 1300 -- --  76 17 PER MINUTE 73 98 % RC   11/03/20 1218 -- -- 67 15 PER MINUTE 67 98 % RC   11/03/20 1215 -- 120/66 -- -- -- -- RC          Physical Exam:  Physical Exam  Vitals and nursing note reviewed.   Constitutional:       Appearance: Normal appearance. He is normal weight.   HENT:      Head: Normocephalic and atraumatic.      Mouth/Throat:      Comments: No oral trauma noted  Eyes:      Extraocular Movements: Extraocular movements intact.      Conjunctiva/sclera: Conjunctivae normal.   Cardiovascular:      Rate and Rhythm: Normal rate and regular rhythm.   Pulmonary:      Effort: Pulmonary effort is normal.      Breath sounds: Normal breath sounds.   Abdominal:      General: There is no distension.      Palpations: Abdomen is soft.      Tenderness: There is no abdominal tenderness.   Musculoskeletal:         General: Normal range of motion.      Cervical back: Neck supple.   Skin:     General: Skin is warm and dry.   Neurological:      Mental Status: He is oriented to person, place, and time. Mental status is at baseline.      Comments: Cranial nerves 2-8, 11, 12 grossly intact  Gait normal  Sensation to light touch intact in all extremities  5/5 distal strength in all extremities   Psychiatric:         Mood and Affect: Mood normal.         Behavior: Behavior normal.         Laboratory Results:  Labs Reviewed   CBC AND DIFF - Abnormal       Result Value Ref Range Status    White Blood Cells 8.2  4.5 - 11.0 K/UL Final    RBC 4.61  4.4 - 5.5 M/UL Final    Hemoglobin 14.2  13.5 - 16.5 GM/DL Final    Hematocrit 95.6  40 - 50 % Final    MCV 90.5  80 - 100 FL Final    MCH 30.7  26 - 34 PG Final    MCHC 33.9  32.0 - 36.0 G/DL Final    RDW 21.3  11 - 15 % Final    Platelet Count 163  150 - 400 K/UL Final    MPV 9.9  7 - 11 FL Final    Neutrophils 71  41 - 77 % Final    Lymphocytes 20 (*) 24 - 44 % Final    Monocytes 7  4 - 12 % Final    Eosinophils 1  0 - 5 % Final    Basophils 1  0 - 2 % Final    Absolute Neutrophil Count 5.90  1.8 - 7.0 K/UL Final    Absolute Lymph Count 1.61  1.0 - 4.8 K/UL Final    Absolute Monocyte Count 0.59  0 - 0.80 K/UL Final    Absolute Eosinophil Count 0.05  0 - 0.45 K/UL Final    Absolute Basophil Count 0.04  0 - 0.20 K/UL Final    MDW (Monocyte Distribution Width) 13.6  <20.7 Final   COMPREHENSIVE METABOLIC PANEL - Abnormal    Sodium 139  137 - 147  MMOL/L Final    Potassium 3.9  3.5 - 5.1 MMOL/L Final    Chloride 104  98 - 110 MMOL/L Final    Glucose 109 (*) 70 - 100 MG/DL Final    Blood Urea Nitrogen 16  7 - 25 MG/DL Final    Creatinine 1.61  0.4 - 1.24 MG/DL Final    Calcium 9.5  8.5 - 10.6 MG/DL Final    Total Protein 7.0  6.0 - 8.0 G/DL Final    Total Bilirubin 0.6  0.3 - 1.2 MG/DL Final    Albumin 4.7  3.5 - 5.0 G/DL Final    Alk Phosphatase 56  25 - 110 U/L Final    AST (SGOT) 17  7 - 40 U/L Final    CO2 27  21 - 30 MMOL/L Final    ALT (SGPT) 15  7 - 56 U/L Final    Anion Gap 8  3 - 12 Final    eGFR >60  >60 mL/min Final   POC GLUCOSE - Abnormal    Glucose, POC 118 (*) 70 - 100 MG/DL Final   URINALYSIS DIPSTICK REFLEX TO CULTURE - Abnormal    Color,UA YELLOW   Final    Turbidity,UA 1+ (*) CLEAR-CLEAR Final    Specific Gravity-Urine 1.028  1.005 - 1.030 Final    pH,UA 7.0  5.0 - 8.0 Final    Protein,UA 1+ (*) NEG-NEG Final    Glucose,UA NEG  NEG-NEG Final    Ketones,UA 1+ (*) NEG-NEG Final    Bilirubin,UA NEG  NEG-NEG Final    Blood,UA NEG  NEG-NEG Final    Urobilinogen,UA NORMAL  NORM-NORMAL Final    Nitrite,UA NEG  NEG-NEG Final    Leukocytes,UA NEG  NEG-NEG Final    Urine Ascorbic Acid, UA POS (*) NEG-NEG Final   CANNABINOIDS-URINE RANDOM - Abnormal    THC POS (*) NEG-NEG Final   URINALYSIS MICROSCOPIC REFLEX TO CULTURE    WBCs,UA 0-2  0 - 2 /HPF Final    RBCs,UA 0-2  0 - 3 /HPF Final    Comment,UA     Final    Value: Criteria for reflex to culture are WBC>10, Positive Nitrite, and/or >=+1   leukocytes. If quantity is not sufficient, an addendum will follow.      MucousUA 1+   Final    Budding Yeast FEW   Final   AMPHETAMINES-URINE RANDOM    Amphetamines NEG  NEG-NEG Final   BARBITURATES-URINE RANDOM    Barbiturates,Urine NEG  NEG-NEG Final   BENZODIAZEPINES-URINE RANDOM    Benzodiazepines NEG  NEG-NEG Final   COCAINE-URINE RANDOM    Cocaine-Urine NEG  NEG-NEG Final   OPIATES-URINE RANDOM    Opiates-Urine NEG  NEG-NEG Final   PHENCYCLIDINES-URINE RANDOM    Phencyclidine (PCP) NEG  NEG-NEG Final   FENTANYL URINE    Fentanyl Urine NEG  NEG-NEG Final   CREATINE KINASE-CPK    Creatine Kinase 136  35 - 232 U/L Final   UA REFLEX LABEL   POC GLUCOSE     POC Glucose (Download): (!) 118    Radiology Interpretation:    CHEST SINGLE VIEW   Final Result         No acute cardiopulmonary abnormality.          Finalized by Golda Acre, M.D. on 11/03/2020 3:23 PM. Dictated by Golda Acre, M.D. on 11/03/2020 3:23 PM.               EKG:  All  results for 12-Lead ECG this visit   ECG 12-LEAD    Collection Time: 11/03/20  1:03 PM   Result Value Status    VENTRICULAR RATE 57 Final    P-R INTERVAL 120 Final    QRS DURATION 110 Final    Q-T INTERVAL 404 Final    QTC CALCULATION (BAZETT) 393 Final    P AXIS 24 Final    R AXIS 72 Final    T AXIS 47 Final    Impression    Sinus bradycardia  Incomplete right bundle branch block  Borderline ECG  When compared with ECG of 03-Nov-2020 13:03, (Unconfirmed)  Previous ECG has undetermined rhythm, needs review  Confirmed by Shari Prows ([id number]) on 11/03/2020 3:32:28 PM       ED Course:  30 y.o. male presenting to emergency department for seizure.  History of similar seizures and has not been taking previously prescribed medications.  Patient had 2 additional episodes of tonic-clonic activity only lasting approximately 15 to 20 seconds in the emergency department.    Objective data  -vitals signs without acute instability.  -physical exam as noted above.   -EKG interpretation as noted above    Labs:  -acutely unremarkable outside of UDS being positive for marijuana which patient self-reported.    Imaging:  -Chest x-ray without acute process    Interventions:  -Neurology consulted and evaluated patient and initially recommended admission to the hospital for further work-up and treatment however patient adamantly declined and preferred outpatient management with prescription home.  Patient understood risks of being discharged without fully being worked up and accepted these risks.  Patient was given oral Tranxene as well as IV valproic acid load.  Prescription for Depakote sent home with patient.  Patient given seizure precautions as well as other return precautions as well.  Advised patient he would need to follow-up with neurology and he stated see well.    Disposition:  -Discussed results, answered questions, reviewed plan for discharge and follow up. Instructed to return to the ED immediately with new/concerning/worsening symptoms. Patient and/or patient's primary caregiver acknowledged understanding instructions.         ED Scoring:                                Coding    Facility Administered Meds:  Medications   valproic acid (DEPACON) 1,224 mg in sodium chloride 0.9% (NS) IVPB (has no administration in time range)   clorazepate dipotassium (TRANXENE) tablet 3.75 mg (3.75 mg Oral Given 11/03/20 1531)       Clinical Impression:  Clinical Impression   Seizure [facility])       Disposition/Follow up  ED Disposition     ED Disposition   Discharge           Neurology: [facility]  [address]  [place] [address]  [phone]          Medications:  New Prescriptions    DIVALPROEX (DEPAKOTE ER) 500 MG ER TABLET    Take one tablet by mouth twice daily. Take with food.       Procedure Notes:  Procedures      Attestation / Supervision:    Daylene Posey MD  Emergency Medicine PGY-3

## 2020-11-27 ENCOUNTER — Encounter: Admit: 2020-11-27 | Discharge: 2020-11-27 | Payer: BC Managed Care – PPO

## 2020-11-27 ENCOUNTER — Ambulatory Visit: Admit: 2020-11-27 | Discharge: 2020-11-27 | Payer: BC Managed Care – PPO

## 2020-11-27 DIAGNOSIS — F419 Anxiety disorder, unspecified: Secondary | ICD-10-CM

## 2020-11-27 DIAGNOSIS — K4090 Unilateral inguinal hernia, without obstruction or gangrene, not specified as recurrent: Secondary | ICD-10-CM

## 2020-11-27 MED ORDER — DIVALPROEX 500 MG PO TB24
500 mg | ORAL_TABLET | Freq: Two times a day (BID) | ORAL | 1 refills | 30.00000 days | Status: AC
Start: 2020-11-27 — End: ?

## 2020-11-27 NOTE — Progress Notes
HISTORY OF PRESENT ILLNESS:  30 y.o. right handedmale here today for follow up of seizures     Interval history: He had a cluster of seizures about a month ago when his brother took him to [facility] ED. He has no recollection of events. He felt light headed prior to the events and sore after. He was confused. He stopped smoking marijuana recently about 3 weeks ago (after his cluster of seizures) and this has been really hard on him. His anxiety has been out of control. He is not interested in any rehab programs. Sleep has also been poor. MRI and EEG were ordered previous but patient did not get. Patient agreeable to getting further work up now. Patient states he has been dealing with a lot in his personal life and work has been stressful as well.     Seizure frequency:  Prior to this ED visit, last seizure was 2021.     Current ASM: Depakote 500mg  BID started after previous ED visit.    Denies any side effects from medications     Epilepsy risk factors (including gestational/birth history):  The patient was the product of a normal spontaneous vaginal delivery of a full term birth.  There was no history of febrile convulsions or CNS infections.  Development was normal and developmental milestones were up to par with age.  No history of head trauma with loss of consciousness.  There are no other risk factors for epilepsy.  ?  He reports he had a little brother with epilepsy that past away at age 51. He reports he had an opened passage in his heart. His seizures became more frequent in his teenage years.     PREVIOUS TESTS:  1. None     ALLERGIES: NKDA    MEDICATIONS:     Current Outpatient Medications:   ?  acetaminophen (TYLENOL) 325 mg tablet, Take two tablets by mouth every 4 hours as needed., Disp: , Rfl:   ?  divalproex (DEPAKOTE ER) 500 mg ER tablet, Take one tablet by mouth twice daily. Take with food., Disp: 120 tablet, Rfl: 0  ?  hydrocortisone 1 % topical cream, Apply  topically to affected area daily. Indications: eczema, Disp: , Rfl:            Past antiepileptic medications: None     Current antiepileptic medications: Depakote       Medical History:   Diagnosis Date   ? Inguinal hernia      Surgical History:   Procedure Laterality Date   ? OPEN RIGHT INGUINAL HERNIA REPAIR WITH MESH Right 01/09/2018    Performed by Janine Ores, DO at [facility] OR     History reviewed. No pertinent family history.  Social History     Socioeconomic History   ? Marital status: Single   Tobacco Use   ? Smoking status: Never Smoker   ? Smokeless tobacco: Never Used   Substance and Sexual Activity   ? Alcohol use: Not Currently   ? Drug use: Not Currently     Types: Marijuana     Comment: daily   ? Sexual activity: Yes     Partners: Female         REVIEW OF SYSTEMS:  The following systems were reviewed and were negative except as mentioned elsewhere:      Constitutional:  No fever/chills/sweat, weight loss, tiredness/fatigue, or poor appetite  Eyes:  No reduced vision or blurriness, double vision, or droopy eyelids  ENT:  No hearing  loss, ringing in the ears, vertigo, or hoarseness  Cardiovascular:  No chest pain/angina or palpitations  Respiratory:  No shortness of breath or cough  Gastrointestinal:  No abdominal pain, nausea and/or vomiting, diarrhea, or constipation  Genitourinary:  No pain with urination, excessive urination, or incontinence  Musculoskeletal:  No back pain, neck pain, joint pain/redness/swelling, or myalgia  Skin:  No jaundice, rash, or change in sweating  Hematologic/ Lymphatic:  No anemia, easy bruising, bleeding, or enlarged lymph nodes  Endocrine:  No temperature intolerance, polyuria, or polydipsia  Allergic/ Immunological:  No severe allergic reaction  Psychiatric:  Anxiety     PHYSICAL AND NEUROLOGICAL EXAMINATION:  Vitals:    11/27/20 1203   BP: 131/73   Pulse: 77        Mental Status: Grossly normal, appears anxious. Shaking his legs, fidgety.  Speech is fluent without dysarthria.    CN:   II: PERRL  III, IV, VI: EOEMI without nystagmus  V: facial sensation is normal  VII: facial movements are symmetric  VIII: hearing is intact to finger rub bilateral  IX/X: palate elevates symmetrically  XI: shoulder shug is normal  XII: tongue is midline.     Motor:  No pronator drift   Normal bulk and tone  Full strength through out     Sensation: Grossly intact to LT    Coordination: Normal FTN and HTS  Gait  Casual: normal  Tandem: normal  Heel Walk: normal  Toe Walk: normal     IMPRESSION AND PLAN:    Mr. Alexis Le is a 30 yo man presenting to Epilepsy clinic as follow up of generalized tonic clonic seizures. 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 The third appeared as a cluster and has the same dysphoric aura prior toevent. These events were described as unresponsiveness, generalized stiffening/shaking with oral trauma and post-ictal confusion lasting 1-2 hours. Patient did not obtain work up ordered at previous visit, agreeable to obtaining now.  ?  MANAGEMENT AND PLAN  During the visit I discussed my impression, recommended diagnostic studies, prognosis, risks and benefits of management, instructions for management, and importance of compliance.  After a discussion, the patient agrees with the plan.  Total time for this office visit was 45 minutes.  ?  RECOMMENDATIONS  1. 65 minute EEG   2. 3T MRI Brain WO    3. Continue depakote ER 500 mg QHS. Obtain CBC, CMP, depakote level today (ordered)  4. Seizure precautions discussed.  5. Referral to psychiatry for management of anxiety and sleep    The patient is instructed not to drive unless 6 months free of alteration of consciousness, not to work in close proximity of machines with moving parts, not to swim unsupervised or to work at high places. The patient is to shower (without accumulation of water) instead of taking a bath if unsupervised.  ?  FOLLOWUP PLAN  I plan to see the patient back for follow-up in approximately 4 months. The patient is instructed to contact me if there are any concerns with the agreed plan.  ?

## 2020-11-28 ENCOUNTER — Encounter: Admit: 2020-11-28 | Discharge: 2020-11-28 | Payer: BC Managed Care – PPO

## 2020-11-28 ENCOUNTER — Emergency Department: Admit: 2020-11-28 | Discharge: 2020-11-28 | Payer: BC Managed Care – PPO

## 2020-11-28 ENCOUNTER — Observation Stay: Admit: 2020-11-28 | Discharge: 2020-11-28 | Payer: BC Managed Care – PPO

## 2020-11-28 DIAGNOSIS — S43215A Anterior dislocation of left sternoclavicular joint, initial encounter: Secondary | ICD-10-CM

## 2020-11-28 DIAGNOSIS — T148XXA Other injury of unspecified body region, initial encounter: Secondary | ICD-10-CM

## 2020-11-28 LAB — CBC AND DIFF
ABSOLUTE BASO COUNT: 0 K/UL (ref 0–0.20)
ABSOLUTE EOS COUNT: 0.4 K/UL — ABNORMAL HIGH (ref 0–0.45)
ABSOLUTE MONO COUNT: 1.1 K/UL — ABNORMAL HIGH (ref 0–0.80)
MCH: 30 pg (ref 26–34)
MCV: 90 FL — ABNORMAL HIGH (ref 80–100)
MDW (MONOCYTE DISTRIBUTION WIDTH): 14 (ref <20.7)
MPV: 9.5 FL (ref 7–11)
NEUTROPHILS %: 56 % (ref 41–77)
RDW: 12 % (ref 11–15)
WBC COUNT: 10 K/UL (ref 4.5–11.0)

## 2020-11-28 LAB — COMPREHENSIVE METABOLIC PANEL
ALK PHOSPHATASE: 48 U/L (ref 25–110)
ALT: 14 U/L (ref 7–56)
ANION GAP: 11 K/UL (ref 3–12)
AST: 19 U/L (ref 7–40)
CHLORIDE: 105 MMOL/L — ABNORMAL LOW (ref 98–110)
CO2: 25 MMOL/L (ref 21–30)
CREATININE: 0.7 mg/dL (ref 0.4–1.24)
EGFR: >60 mL/min (ref >60)
POTASSIUM: 3.3 MMOL/L — ABNORMAL LOW (ref 3.5–5.1)
SODIUM: 141 MMOL/L (ref 137–147)
TOTAL PROTEIN: 6.7 g/dL — ABNORMAL LOW (ref 6.0–8.0)

## 2020-11-28 LAB — CREATINE KINASE-CPK: CK TOTAL: 132 U/L (ref 35–232)

## 2020-11-28 LAB — POC TROPONIN: TROPONIN I, POC: 0 ng/mL (ref 0.00–0.05)

## 2020-11-28 MED ORDER — DIVALPROEX 500 MG PO TB24
500 mg | Freq: Two times a day (BID) | ORAL | 0 refills | Status: AC
Start: 2020-11-28 — End: ?
  Administered 2020-11-29: 14:00:00 500 mg via ORAL

## 2020-11-28 MED ORDER — LACTATED RINGERS IV SOLP
1000 mL | INTRAVENOUS | 0 refills | Status: CP
Start: 2020-11-28 — End: ?
  Administered 2020-11-29: 02:00:00 1000 mL via INTRAVENOUS

## 2020-11-28 MED ORDER — FENTANYL CITRATE (PF) 50 MCG/ML IJ SOLN
50 ug | Freq: Once | INTRAVENOUS | 0 refills | Status: CP
Start: 2020-11-28 — End: ?
  Administered 2020-11-28: 23:00:00 50 ug via INTRAVENOUS

## 2020-11-28 MED ORDER — FENTANYL CITRATE (PF) 50 MCG/ML IJ SOLN
50 ug | Freq: Once | INTRAVENOUS | 0 refills | Status: CP
Start: 2020-11-28 — End: ?
  Administered 2020-11-29: 02:00:00 50 ug via INTRAVENOUS

## 2020-11-28 MED ORDER — DIPHTH,PERTUS(ACELL),TETANUS 2.5-8-5 LF-MCG-LF/0.5ML IM SUSP
.5 mL | Freq: Once | INTRAMUSCULAR | 0 refills | Status: CP
Start: 2020-11-28 — End: ?
  Administered 2020-11-29: 01:00:00 0.5 mL via INTRAMUSCULAR

## 2020-11-28 MED ORDER — IOHEXOL 350 MG IODINE/ML IV SOLN
150 mL | Freq: Once | INTRAVENOUS | 0 refills | Status: CP
Start: 2020-11-28 — End: ?
  Administered 2020-11-28: 150 mL via INTRAVENOUS

## 2020-11-28 MED ORDER — DIVALPROEX 250 MG PO TBEC
500 mg | Freq: Once | ORAL | 0 refills | Status: CP
Start: 2020-11-28 — End: ?
  Administered 2020-11-29: 01:00:00 500 mg via ORAL

## 2020-11-28 MED ORDER — ACETAMINOPHEN 325 MG PO TAB
650 mg | ORAL | 0 refills | Status: AC
Start: 2020-11-28 — End: ?
  Administered 2020-11-29 (×4): 650 mg via ORAL

## 2020-11-28 MED ORDER — BACITRACIN ZINC 500 UNIT/GRAM TP OINT
Freq: Once | TOPICAL | 0 refills | Status: CP
Start: 2020-11-28 — End: ?
  Administered 2020-11-29: 01:00:00 via TOPICAL

## 2020-11-28 MED ORDER — ENOXAPARIN 30 MG/0.3 ML SC SYRG
30 mg | Freq: Two times a day (BID) | SUBCUTANEOUS | 0 refills | Status: AC
Start: 2020-11-28 — End: ?
  Administered 2020-11-29: 17:00:00 30 mg via SUBCUTANEOUS

## 2020-11-28 MED ORDER — LACTATED RINGERS IV SOLP
INTRAVENOUS | 0 refills | Status: AC
Start: 2020-11-28 — End: ?
  Administered 2020-11-29: 06:00:00 1000.000 mL via INTRAVENOUS

## 2020-11-28 MED ORDER — SODIUM CHLORIDE 0.9 % IJ SOLN
50 mL | Freq: Once | INTRAVENOUS | 0 refills | Status: CP
Start: 2020-11-28 — End: ?
  Administered 2020-11-28: 50 mL via INTRAVENOUS

## 2020-11-28 MED ORDER — OXYCODONE 5 MG PO TAB
5 mg | ORAL | 0 refills | Status: AC | PRN
Start: 2020-11-28 — End: ?
  Administered 2020-11-29 (×3): 5 mg via ORAL

## 2020-11-29 ENCOUNTER — Encounter: Admit: 2020-11-29 | Discharge: 2020-11-29 | Payer: BC Managed Care – PPO

## 2020-11-29 ENCOUNTER — Observation Stay: Admit: 2020-11-29 | Discharge: 2020-11-29 | Payer: BC Managed Care – PPO

## 2020-11-29 DIAGNOSIS — K4090 Unilateral inguinal hernia, without obstruction or gangrene, not specified as recurrent: Secondary | ICD-10-CM

## 2020-11-29 MED ORDER — MIDAZOLAM 1 MG/ML IJ SOLN
INTRAVENOUS | 0 refills | Status: DC
Start: 2020-11-29 — End: 2020-11-29
  Administered 2020-11-29: 16:00:00 2 mg via INTRAVENOUS

## 2020-11-29 MED ORDER — PROPOFOL 10 MG/ML IV EMUL 50 ML (INFUSION)(AM)(OR)
INTRAVENOUS | 0 refills | Status: DC
Start: 2020-11-29 — End: 2020-11-29
  Administered 2020-11-29: 16:00:00 100 ug/kg/min via INTRAVENOUS

## 2020-11-29 MED ORDER — SUCCINYLCHOLINE CHLORIDE 20 MG/ML IJ SOLN
INTRAVENOUS | 0 refills | Status: DC
Start: 2020-11-29 — End: 2020-11-29
  Administered 2020-11-29: 16:00:00 100 mg via INTRAVENOUS

## 2020-11-29 MED ORDER — LIDOCAINE (PF) 200 MG/10 ML (2 %) IJ SYRG
INTRAVENOUS | 0 refills | Status: DC
Start: 2020-11-29 — End: 2020-11-29
  Administered 2020-11-29: 16:00:00 60 mg via INTRAVENOUS

## 2020-11-29 MED ORDER — PROPOFOL INJ 10 MG/ML IV VIAL
INTRAVENOUS | 0 refills | Status: DC
Start: 2020-11-29 — End: 2020-11-29
  Administered 2020-11-29: 16:00:00 80 mg via INTRAVENOUS

## 2020-11-29 MED ADMIN — DOCUSATE SODIUM 100 MG PO CAP [2566]: 100 mg | ORAL | @ 14:00:00 | Stop: 2020-11-30 | NDC 00904718361

## 2020-11-29 MED ADMIN — POLYETHYLENE GLYCOL 3350 17 GRAM PO PWPK [25424]: 17 g | ORAL | @ 14:00:00 | Stop: 2020-11-30 | NDC 00904693186

## 2020-11-29 MED ADMIN — OXYCODONE 5 MG PO TAB [10814]: 10 mg | ORAL | @ 18:00:00 | Stop: 2020-11-30 | NDC 00904696661

## 2020-11-29 MED ADMIN — LACTATED RINGERS IV SOLP [4318]: 1000 mL | INTRAVENOUS | @ 15:00:00 | Stop: 2020-11-29 | NDC 00338011704

## 2020-11-29 MED ADMIN — ONDANSETRON HCL (PF) 4 MG/2 ML IJ SOLN [136012]: 4 mg | INTRAVENOUS | @ 22:00:00 | Stop: 2020-11-30 | NDC 36000001225

## 2020-11-29 MED FILL — NALOXONE 4 MG/ACTUATION NA SPRY: 4 mg/actuation | 1 days supply | Qty: 2 | Fill #1 | Status: CP

## 2020-11-29 MED FILL — OXYCODONE 5 MG PO TAB: 5 mg | ORAL | 3 days supply | Qty: 20 | Fill #1 | Status: CP

## 2020-11-29 NOTE — Consults
[facility] Consult     Patient: Alexis Le, [id number]  Admission Date:  11/28/2020, LOS: 0 days  Admission Diagnosis: No admission diagnoses are documented for this encounter.  Date of Service: November 28, 2020  CONSULT TRAUMA/SICU/CRITICAL CARE SURGERY PHYSICIAN  Consult performed by: Golden Circle, MD  Consult ordered by: Harless Nakayama, MD           ASSESSMENT:   Alexis Le is a 30 y.o. male with PMH of seizure disorder on Depakote who presents for a crush injury from a car, with L anterior sternoclavicular dislocation with associated displaced small clavicular head fracture.     PLAN:  - Admit to trauma floor for observation  - NPO, can have sips with meds, mIVF for hydration  - Obtained ortho consult, appreciate recs  - MMPC with oxycodone and tylenol  - Will place on tele given blunt cardiac trauma; will monitor troponins   - Ordered PEP and PAP therapy; will make sure pt works on Facilities manager and flutter valve  - DVT ppx with lovenox    Seen and discussed with staff surgeon, Dr. Kristie Cowman, MD  Service Pager: [phone]  _____________________________________________________________________________    HPI: Alexis Le is a 30 y.o. male with PMH of seizure disorder on Depakote who presents for a crush injury from a car, with L anterior sternoclavicular dislocation with associated displaced small clavicular head fracture.     He stated that earlier today he was working on a car, and the shaft of the car fell onto his chest. He did not hit his head during this time or lose consciousness. He stated that he is primarily having pain along his L shoulder and L chest wall. Otherwise, he denies any other symptoms.  PMH: Seizure disorder  Meds: Depakote, denies any use of blood thinners  PSurgHx: R inguinal hernia repair  Vital signs are stable. Labs are all within normal limits with troponin of 0. CTA chest showing anterior dislocation of the medial clavicular head with small associated displaced fracture fragment, and with mild widening of the left AC joint.    Medical History:   Diagnosis Date   ? Inguinal hernia      Surgical History:   Procedure Laterality Date   ? OPEN RIGHT INGUINAL HERNIA REPAIR WITH MESH Right 01/09/2018    Performed by Janine Ores, DO at [facility] OR     No family history on file.  Social History     Tobacco Use   ? Smoking status: Never Smoker   ? Smokeless tobacco: Never Used   Substance Use Topics   ? Alcohol use: Not Currently     Your Current Medications:       Instructions    acetaminophen (TYLENOL) 325 mg tablet Take two tablets by mouth every 4 hours as needed.    divalproex (DEPAKOTE ER) 500 mg ER tablet Take one tablet by mouth twice daily. Take with food.    hydrocortisone 1 % topical cream Apply  topically to affected area daily. Indications: eczema          Review of Systems   Constitutional: Negative.    HENT: Negative.    Eyes: Negative.    Respiratory: Negative.    Cardiovascular: Negative.    Gastrointestinal: Negative.    Genitourinary: Negative.    Musculoskeletal: Positive for joint pain.        L shoulder and L-sided chest pain.  Skin: Negative.    Neurological: Negative.    Endo/Heme/Allergies: Negative.    Psychiatric/Behavioral: Negative.        Vitals:  BP: (124-141)/(73-83)   Respirations:  [17 PER MINUTE]   SpO2:  [97 %-98 %]   There is no height or weight on file to calculate BMI.  BMI Category:  Acceptable (19 to <25)    Physical Exam  Constitutional:       Appearance: Normal appearance. He is normal weight.   HENT:      Head: Normocephalic and atraumatic.   Eyes:      Extraocular Movements: Extraocular movements intact.      Conjunctiva/sclera: Conjunctivae normal.   Pulmonary:      Effort: Pulmonary effort is normal.   Abdominal:      General: There is no distension.      Palpations: Abdomen is soft.      Tenderness: There is no abdominal tenderness.   Musculoskeletal:      Cervical back: Normal range of motion.      Comments: Obvious deformity of L clavicle. Small abrasions noted along L elbow and hand.    Skin:     General: Skin is warm and dry.   Neurological:      General: No focal deficit present.      Mental Status: He is alert and oriented to person, place, and time.   Psychiatric:         Mood and Affect: Mood normal.         Behavior: Behavior normal.         Lab/Radiology/Other Diagnostic Tests:  Anemia: No  Renal Function: Normal  Acidosis:Normal  Electrolyte Abnormalities:    Sodium: Normal   Potassium: Hypokalemia present (K <4.0 mmol/L)   Magnesium:Normal   Phosphorous:Normal  Calcium:Normal    Lab Results   Component Value Date/Time    HGB 13.5 11/28/2020 06:02 PM    HCT 39.9 (L) 11/28/2020 06:02 PM    WBC 10.4 11/28/2020 06:02 PM    PLTCT 138 (L) 11/28/2020 06:02 PM    INR 1.1 06/26/2018 12:22 PM     Lab Results   Component Value Date/Time    NA 141 11/28/2020 06:02 PM    K 3.3 (L) 11/28/2020 06:02 PM    CL 105 11/28/2020 06:02 PM    CO2 25 11/28/2020 06:02 PM    BUN 17 11/28/2020 06:02 PM    CR 0.78 11/28/2020 06:02 PM    CA 8.9 11/28/2020 06:02 PM     Lab Results   Component Value Date/Time    GLUPOC 118 (H) 11/03/2020 12:55 PM                 HAND MIN 3 VIEWS RIGHT   Final Result   Findings/impression:      Frontal, lateral and oblique views of the right hand obtained. Distal radius and ulna are intact. Carpus maintained. Metacarpals and phalanges appear intact. No fracture or dislocation. No lytic or blastic lesion. No radiopaque foreign body.          Finalized by Earlyne Iba, MD, PhD on 11/28/2020 7:34 PM. Dictated by Earlyne Iba, MD, PhD on 11/28/2020 7:34 PM.         ELBOW MIN 3 VIEWS LEFT   Final Result         No acute fracture or dislocation of the left shoulder or left elbow.          Finalized by Earlyne Iba, MD, PhD  on 11/28/2020 7:39 PM. Dictated by Earlyne Iba, MD, PhD on 11/28/2020 7:38 PM.         SHOULDER MIN 2 VIEWS LEFT   Final Result         No acute fracture or dislocation of the left shoulder or left elbow.          Finalized by Earlyne Iba, MD, PhD on 11/28/2020 7:39 PM. Dictated by Earlyne Iba, MD, PhD on 11/28/2020 7:38 PM.         CTA NECK WO/W CONT   Final Result         1.  Patent cervical carotid and vertebral arteries without white cerebrovascular injury or stenosis.   2.  Left anterior sternoclavicular dislocation with associated displaced small clavicular head fracture.           Finalized by Dimple Casey, M.D. on 11/28/2020 7:04 PM. Dictated by Dimple Casey, M.D. on 11/28/2020 6:57 PM.         CTA CHEST WO/W CONT   Final Result      1.  Acute anterior dislocation of the medial clavicular head with small associated displaced fracture fragment, and with mild widening of the left acromial clavicular joint.   2.  No traumatic vascular injury is identified.      By my electronic signature, I attest that I have personally reviewed the images for this examination and formulated the interpretations and opinions expressed in this report          Finalized by Earlyne Iba, MD, PhD on 11/28/2020 7:42 PM. Dictated by Ellsworth Lennox, D.O. on 11/28/2020 7:14 PM.                                                   Active Wounds

## 2020-11-29 NOTE — Anesthesia Post-Procedure Evaluation
Post-Anesthesia Evaluation    Name: Alexis Le      MRN: [id number]     DOB: 1990/10/03     Age: 30 y.o.     Sex: male   __________________________________________________________________________     Procedure Information     Anesthesia Start Date/Time: 11/29/20 1043    Procedure: CLOSED TREATMENT CLAVICULAR FRACTURE (Left Shoulder) - Closed vs. Open reduction of Sternoclavicular dislocation    Location: MAIN OR 40 / Main OR/Periop    Surgeons: Windell Norfolk, MD          Post-Anesthesia Vitals  BP: 116/70 (09/14 1130)  Temp: 36.1 C (96.9 F) (09/14 1112)  Pulse: 81 (09/14 1130)  Respirations: 16 PER MINUTE (09/14 1130)  SpO2: 99 % (09/14 1130)  SpO2 Pulse: 80 (09/14 1130)  O2 Device: None (Room air) (09/14 1115)   Vitals Value Taken Time   BP 116/70 11/29/20 1130   Temp 36.1 C (96.9 F) 11/29/20 1112   Pulse 81 11/29/20 1130   Respirations 16 PER MINUTE 11/29/20 1130   SpO2 99 % 11/29/20 1130   O2 Device None (Room air) 11/29/20 1115   ABP     ART BP           Post Anesthesia Evaluation Note    Evaluation location: Pre/Post  Patient participation: recovered; patient participated in evaluation  Level of consciousness: alert    Pain score: 0  Pain management: adequate    Hydration: normovolemia  Temperature: 36.0C - 38.4C  Airway patency: adequate    Perioperative Events       Post-op nausea and vomiting: no PONV    Postoperative Status  Cardiovascular status: hemodynamically stable  Respiratory status: spontaneous ventilation  Follow-up needed: none        Perioperative Events  There were no known complications for this encounter.

## 2020-11-29 NOTE — Consults
[facility] Consult Note      Admission Date: 11/28/2020                                                  Chief Complaint/Reason for Consult:  Left SC Joint anterior dislocation     Assessment/Plan  Sherrick Schaper is a 30 y.o. male with pmh of seizures who presents with a L SC anterior joint dislocation s/p crush injury    - Images reviewed  - Images, history and clinical presentation consistent with the above mentioned injury  -Operative Intervention - No acute operative intervention. After discussion with the patient we would like to proceed with closed reduction attempt. The patient was agreeable to this. I was informed by the ED staff that they would be unable to provide sedation at any point tonight due to staffing shortages/issues. I informed the patient that we will have to wait until tomorrow to possibly close vs. Open reduce in the operating room. He was frustrated by this news but understanding. Please keep NPO at midnight. Sling for comfort in the interim.  -WB Status - NWB LUE  -Antibiotics / Tetanus - per primary  -Pain Control - per primary  -Diet - NPO at midnight  -DVT PPX - Mechanical, Chemoprophylaxis  - per primary    Patient discussed with staff surgeon Dr. Desiree Hane.      During normal business hours, please contact Tiffany Ward, Geni Bers, Danae Chen, Ricky Clitherall. At all other times, contact the orthopedic surgery resident on call for any questions or concerns.     ______________________________________________________________________    History of Present Illness: Beni Kubas is a 30 y.o. male.  Patient reports that he was working on a car when the engine block fell on him. He has had pain in his left shoulder since this injury. He denies any prior surgeries to his left clavicle/shoulder. He is right hand dominant. He denies pain in any other extremity at this time. He does not take any blood thinning medication and smokes marijuana.     Medical History: Diagnosis Date   ? Inguinal hernia      Surgical History:   Procedure Laterality Date   ? OPEN RIGHT INGUINAL HERNIA REPAIR WITH MESH Right 01/09/2018    Performed by Janine Ores, DO at [facility] OR     Social History     Tobacco Use   ? Smoking status: Never Smoker   ? Smokeless tobacco: Never Used   Substance Use Topics   ? Alcohol use: Not Currently   ? Drug use: Not Currently     Types: Marijuana     Comment: daily     No family history on file.  Allergies:  Patient has no known allergies.    Outpatient Medications as of 11/28/2020   Medication Sig Dispense Refill   ? acetaminophen (TYLENOL) 325 mg tablet Take two tablets by mouth every 4 hours as needed.     ? divalproex (DEPAKOTE ER) 500 mg ER tablet Take one tablet by mouth twice daily. Take with food. 120 tablet 1   ? hydrocortisone 1 % topical cream Apply  topically to affected area daily. Indications: eczema           Review of Systems:  10 Point Review of Systems Obtained. Pertinent items noted in HPI.  Pos - Left chest/shoulder pain    Vital Signs:  Last Filed in 24 hours   BP: 132/76 (09/13 2030)  Respirations: 17 PER MINUTE (09/13 2030)  SpO2: 97 % (09/13 2030)  SpO2 Pulse: 75 (09/13 2030)     Physical Exam:    Constitutional: Alert, NAD  HEENT: Normocephalic, no scleral icterus  Respiratory: Unlabored respirations on RA  Cardiovascular: Regular rate  Abdomen: Nondistended, NTTP  Lymph: No significant lymphedema, no lymphadenopathy of affected extremity  Skin: Normal Turgor  Musculoskeletal:      LUE: Able to flex/extend the elbow, wrist and fingers; AIN/PIN/ulnar motor function intact, SILT in radial/median/ulnar nerve distributions; 2+ radial pulse, hand well perfused, soft compartments,  warm extremity, <2s cap refill. There is an obvious deformity of his Left SC joint. There are no open skin injuries. There is pain with palpation at the area of prominence of his medial clavicle.      Neurologic: Grossly intact, No focal deficits noted unless listed above    Lab/Radiology/Other Diagnostic Tests:    CBC w/Diff   Lab Results   Component Value Date/Time    WBC 10.4 11/28/2020 06:02 PM    HGB 13.5 11/28/2020 06:02 PM    HCT 39.9 (L) 11/28/2020 06:02 PM    PLTCT 138 (L) 11/28/2020 06:02 PM        Inflammatory Markers   No results found for: ESR, CRP     Basic Metabolic Profile   Lab Results   Component Value Date/Time    NA 141 11/28/2020 06:02 PM    K 3.3 (L) 11/28/2020 06:02 PM    CL 105 11/28/2020 06:02 PM    CO2 25 11/28/2020 06:02 PM    GAP 11 11/28/2020 06:02 PM    BUN 17 11/28/2020 06:02 PM    CR 0.78 11/28/2020 06:02 PM    GLU 108 (H) 11/28/2020 06:02 PM        Coagulation Studies   Lab Results   Component Value Date/Time    PTT 38.0 (H) 06/26/2018 12:22 PM    INR 1.1 06/26/2018 12:22 PM        Radiology:   CTA NECK WO/W CONT    Result Date: 11/28/2020  EXAM: CTA NECK HISTORY: Car/drive shaft fell onto chest, proximal L clavicle deformity, midsternal pain, TECHNIQUE: Multiple contiguous axial images were obtained of the neck following the administration of IV contrast. CTA maximum density projection images were obtained of the neck with image post processing.    Comparison: Same-day CTA chest FINDINGS: Three-vessel arch branching pattern with patent origins.  The common carotid and cervical portions of the internal carotid and vertebral arteries are patent without focal narrowing according to NASCET criteria. Tuberosity of the distal cervical ICA is No aneurysm, AVM, or dissection is identified. There is anterior dislocation of the left clavicular head the sternoclavicular joint. There is a small displaced fracture fragment of the left clavicular head which is positioned posterior and deep dislocated head. There is soft tissue thickening and edema in the left supraclavicular fossa, deep to the clavicular dislocation..  The paranasal sinus and mastoid air cells are clear.  The lung apices are clear.      1.  Patent cervical carotid and vertebral arteries without white cerebrovascular injury or stenosis. 2.  Left anterior sternoclavicular dislocation with associated displaced small clavicular head fracture.  Finalized by Dimple Casey, M.D. on 11/28/2020 7:04 PM. Dictated by Dimple Casey, M.D. on 11/28/2020 6:57 PM.     CTA CHEST WO/W  CONT    Result Date: 11/28/2020  CTA CHEST Clinical Indication:  Male, 30 years old, Car/drive shaft fell onto chest, proximal L clavicle deformity, midsternal pain Technique: Multiple contiguous axial CT images were obtained through the chest following the administration of IV contrast.Noncontrast imaging was also obtained. Post processing coronal and sagittal reconstruction images were made from the axial images.  Image post-processing was obtained.  IV contrast: Yes Comparison: CT chest/abdomen/pelvis 06/26/2018 FINDINGS: Lower Neck: Unremarkable. Axilla, Mediastinum and Hila: Small amount of anterior mediastinal soft tissue, similar to prior exam, likely representing residual thymic tissue. No mediastinal fluid collection. No axillary, mediastinal, or hilar lymphadenopathy. Heart and Great Vessels: The heart is normal in size without pericardial effusion. No CTA evidence of traumatic injury to the thoracic aorta or supraaortic branch vessels. The thoracic aorta is normal in caliber without significant atherosclerotic plaque. The main pulmonary artery is normal in caliber. Airway, Lungs and Pleura: No focal consolidation. No evidence of pulmonary contusion. No pleural effusion or pneumothorax. Upper Abdomen: No acute upper abdominal findings. Chest Wall and Osseous Structures: There is anterior dislocation of the proximal left clavicle approximately 1 clavicle width anteriorly. Associated small displaced fracture fragment along the posterior aspect of the displaced proximal clavicle. There is subtle cortical lucency in the mid to distal left clavicle, likely representing nutrient foramen rather than additional area of nondisplaced fracture (Series 2 image 18 and coronal image 74). There is mild widening of the left acromioclavicular joint space. No displaced rib fractures.     1.  Acute anterior dislocation of the medial clavicular head with small associated displaced fracture fragment, and with mild widening of the left acromial clavicular joint. 2.  No traumatic vascular injury is identified. By my electronic signature, I attest that I have personally reviewed the images for this examination and formulated the interpretations and opinions expressed in this report  Finalized by Earlyne Iba, MD, PhD on 11/28/2020 7:42 PM. Dictated by Ellsworth Lennox, D.O. on 11/28/2020 7:14 PM.     CHEST SINGLE VIEW    Result Date: 11/03/2020  Single view INDICATION: Seizures. Concern for aspiration. COMPARISON CHEST FILM: Aug 11, 2017 FINDINGS: Devices: None Heart And Pulmonary Vasculature: The heart size is normal without pulmonary vascular congestion. Lungs and Pleura: There are no consolidating infiltrates, effusions, or pneumothoraces identified.     No acute cardiopulmonary abnormality.  Finalized by Golda Acre, M.D. on 11/03/2020 3:23 PM. Dictated by Golda Acre, M.D. on 11/03/2020 3:23 PM.     ELBOW MIN 3 VIEWS LEFT    Result Date: 11/28/2020  ELBOW MIN 3 VIEWS LEFT, SHOULDER MIN 2 VIEWS LEFT Clinical indication: CRUSH INJURY. Prior studies: None Findings: Left shoulder: No acute fracture or dislocation. The [facility] joint is intact. Left lateral ribs and left lung unremarkable. Left elbow: Frontal, lateral and oblique views left elbow obtained. No acute fracture or dislocation identified. Negative anterior and posterior fat pads.     No acute fracture or dislocation of the left shoulder or left elbow.  Finalized by Earlyne Iba, MD, PhD on 11/28/2020 7:39 PM. Dictated by Earlyne Iba, MD, PhD on 11/28/2020 7:38 PM.     HAND MIN 3 VIEWS RIGHT    Result Date: 11/28/2020  HAND MIN 3 VIEWS RIGHT Clinical indication: CRUSH INJURY. Prior studies: None Findings/impression: Frontal, lateral and oblique views of the right hand obtained. Distal radius and ulna are intact. Carpus maintained. Metacarpals and phalanges appear intact. No fracture or dislocation. No lytic or blastic lesion. No radiopaque foreign body.  Finalized by Earlyne Iba, MD, PhD on 11/28/2020 7:34 PM. Dictated by Earlyne Iba, MD, PhD on 11/28/2020 7:34 PM.     SHOULDER MIN 2 VIEWS LEFT    Result Date: 11/28/2020  ELBOW MIN 3 VIEWS LEFT, SHOULDER MIN 2 VIEWS LEFT Clinical indication: CRUSH INJURY. Prior studies: None Findings: Left shoulder: No acute fracture or dislocation. The [facility] joint is intact. Left lateral ribs and left lung unremarkable. Left elbow: Frontal, lateral and oblique views left elbow obtained. No acute fracture or dislocation identified. Negative anterior and posterior fat pads.     No acute fracture or dislocation of the left shoulder or left elbow.  Finalized by Earlyne Iba, MD, PhD on 11/28/2020 7:39 PM. Dictated by Earlyne Iba, MD, PhD on 11/28/2020 7:38 PM.       Marcial Pacas, MD  [phone]

## 2020-11-30 ENCOUNTER — Encounter: Admit: 2020-11-30 | Discharge: 2020-11-30 | Payer: BC Managed Care – PPO

## 2020-11-30 MED ORDER — ONDANSETRON HCL 4 MG PO TAB
4 mg | ORAL_TABLET | ORAL | 0 refills | 8.00000 days | Status: AC | PRN
Start: 2020-11-30 — End: ?
  Filled 2020-11-30: qty 10, 3d supply, fill #1

## 2020-11-30 MED ORDER — HYDROCODONE-ACETAMINOPHEN 5-325 MG PO TAB
1-2 | ORAL_TABLET | ORAL | 0 refills | 30.00000 days | Status: AC | PRN
Start: 2020-11-30 — End: ?
  Filled 2020-11-30: qty 40, 5d supply, fill #1

## 2020-11-30 NOTE — Telephone Encounter
Pt called c/o n/v which taking oxycodone. He states the vomiting was highly irritating to his post op wound as this is near his neck/head area and caused lots of pressure on the wound. He stopped oxycodone which resolved the vomiting but he is in intolerable pain now at his surgery site. otc acetaminophen and ibuprofen are not bringing pain levels down to tolerable levels . Pt would like to try going down to hydrocodone and see if he tolerates that med better. Request for hydro rx sent to provider. rx for zofran #10 sent to pharmacy for pt to take prn for future n/v episodes.

## 2020-12-01 ENCOUNTER — Encounter: Admit: 2020-12-01 | Discharge: 2020-12-01 | Payer: BC Managed Care – PPO

## 2020-12-01 DIAGNOSIS — K4090 Unilateral inguinal hernia, without obstruction or gangrene, not specified as recurrent: Secondary | ICD-10-CM

## 2020-12-03 ENCOUNTER — Encounter: Admit: 2020-12-03 | Discharge: 2020-12-03 | Payer: BC Managed Care – PPO

## 2020-12-03 ENCOUNTER — Emergency Department: Admit: 2020-12-03 | Discharge: 2020-12-04 | Payer: BC Managed Care – PPO

## 2020-12-03 ENCOUNTER — Emergency Department: Admit: 2020-12-03 | Discharge: 2020-12-03 | Payer: BC Managed Care – PPO

## 2020-12-03 DIAGNOSIS — M898X1 Other specified disorders of bone, shoulder: Secondary | ICD-10-CM

## 2020-12-03 NOTE — ED Provider Notes
Alexis Le is a 30 y.o. male.    Chief Complaint:  Chief Complaint   Patient presents with   ? Post-Op Problem     Pt recently had collar bone set is in a sling has been vomiting and felt a pop today states he is more swollen       History of Present Illness:  Alexis Le is a 30 y.o. male, with a history of right inguinal hernia, fracture of the left clavicle and impaired mobility, who presents to the emergency department for a post-op problem. On 11/28/20 the patient had suffered a chest injury after a vehicle he was working under rolled onto his chest. The patient was admitted and underwent a surgery with [facility]. The patient was prescribed and endorses compliance with hydrocodone, ibuprofen and zolfran medications.     On 12/02/20, the patient was eating dinner when he felt a pop in his neck, followed by an immediate throbbing sensation. The patient endorses associated sharp pain located on the left-side of neck, and intermittent CP. Patient also endorses nausea and vomiting due to hydrocodone medication. Otherwise, the patient has not experienced a bowel movement for the past 2 days but is taking Miralax to address his concerns.       History provided by:  Patient  Language interpreter used: No        Review of Systems:  Review of Systems   Constitutional: Negative for fever.   HENT: Negative for sore throat.    Eyes: Negative for visual disturbance.   Respiratory: Positive for shortness of breath.    Cardiovascular: Positive for chest pain.   Gastrointestinal: Positive for nausea and vomiting. Negative for abdominal pain.   Genitourinary: Negative for dysuria.   Musculoskeletal: Positive for neck pain. Negative for back pain.   Skin: Negative for rash.   Neurological: Negative for headaches.       Allergies:  Patient has no known allergies.    Past Medical History:  Medical History:   Diagnosis Date   ? Inguinal hernia        Past Surgical History:  Surgical History: Procedure Laterality Date   ? OPEN RIGHT INGUINAL HERNIA REPAIR WITH MESH Right 01/09/2018    Performed by Janine Ores, DO at [facility] OR   ? CLOSED TREATMENT CLAVICULAR FRACTURE Left 11/29/2020    Performed by Heddings, Revonda Standard, MD at [facility] OR       Pertinent medical/surgical history reviewed  Medical History:   Diagnosis Date   ? Inguinal hernia      Surgical History:   Procedure Laterality Date   ? OPEN RIGHT INGUINAL HERNIA REPAIR WITH MESH Right 01/09/2018    Performed by Janine Ores, DO at [facility] OR   ? CLOSED TREATMENT CLAVICULAR FRACTURE Left 11/29/2020    Performed by Heddings, Revonda Standard, MD at [facility] OR       Social History:  Social History     Tobacco Use   ? Smoking status: Never Smoker   ? Smokeless tobacco: Never Used   Substance Use Topics   ? Alcohol use: Not Currently   ? Drug use: Not Currently     Types: Marijuana     Comment: daily     Social History     Substance and Sexual Activity   Drug Use Not Currently   ? Types: Marijuana    Comment: daily             Family History:  No  family history on file.    Vitals:  ED Vitals    Date and Time T BP P RR SPO2P SPO2 User   12/03/20 1930 -- 116/67 -- -- 64 97 % EH   12/03/20 1719 36.4 ?C (97.6 ?F) 128/68 75 16 PER MINUTE 81 98 % EH          Physical Exam:  Physical Exam  Vitals and nursing note reviewed.   Constitutional:       Appearance: Normal appearance. He is normal weight.   HENT:      Head: Normocephalic and atraumatic.   Eyes:      Extraocular Movements: Extraocular movements intact.      Conjunctiva/sclera: Conjunctivae normal.   Neck:      Comments: Deformity of left sternoclavicular joint with over-lining bruising and minimal swelling  Cardiovascular:      Rate and Rhythm: Normal rate and regular rhythm.      Pulses: Normal pulses.      Heart sounds: Normal heart sounds.   Pulmonary:      Effort: Pulmonary effort is normal.      Breath sounds: Normal breath sounds.   Abdominal:      General: Bowel sounds are normal. There is no distension. Palpations: Abdomen is soft.      Tenderness: There is no abdominal tenderness.   Musculoskeletal:         General: Normal range of motion.      Cervical back: Neck supple.   Skin:     General: Skin is warm and dry.      Findings: Bruising present.   Neurological:      Mental Status: He is oriented to person, place, and time. Mental status is at baseline.   Psychiatric:         Mood and Affect: Mood normal.         Behavior: Behavior normal.         Laboratory Results:  Labs Reviewed - No data to display       Radiology Interpretation:    CLAVICLE LEFT   Final Result   Findings/Impression:      1.  Known small fracture related to recently reduced left sternoclavicular dislocation is not visualized radiographically. Evaluation for recurrent sternoclavicular dislocation/subluxation is limited; correlate with physical exam. No other acute fracture or dislocation is identified.   2.  Normal alignment of the AC joints and the provided views. Visualized joint spaces are maintained.   3.  Subtle asymmetric soft tissue swelling overlying the medial left clavicle.   4.  Visualized lung apices are clear.          Finalized by Jason Coop, M.D. on 12/03/2020 6:32 PM. Dictated by Jason Coop, M.D. on 12/03/2020 6:22 PM.               EKG:      ED Course:  Patient is a 30 y.o. male who presented to the ED for left clavicular pain in the context of recent trauma.  - Patient was evaluated by resident and attending physicians.  - Available records were reviewed.  - Vitals were stable.    - X-ray shows no new fractures.  Patient was instructed on pain management at home.  Also instructed on bowel regimen.  Instructed to follow-up with primary care provider and surgical clinic.      - Dispo: Discharge  - Patient reassessed with appropriate vital signs and symptom control for discharge.   - Provided patient with  discharge information, strict return precautions, and follow up instructions. Patient verbalized understanding. All questions were answered prior to discharge. The patient was discharged from the emergency department in stable condition, demonstrating a clear understanding of return precautions and agreeable to follow up planning.        ED Scoring:                                Coding    Facility Administered Meds:  Medications - No data to display      Clinical Impression:  Clinical Impression   Clavicle pain       Disposition/Follow up  ED Disposition     ED Disposition   Discharge           [place]  [address]  [place] [address]  [phone]          Surgery: [facility]  [address]  [place] [address]  [phone]          Medications:  Discharge Medication List as of 12/03/2020  7:15 PM          Procedure Notes:  Procedures      Attestation / Supervision:  I, Amira Nuur, am scribing for and in the presence of Bari Mantis, MD.      Amira Nuur    I, Bari Mantis, MD, personally performed the services described in this documentation as scribed and it is both accurate and complete.    Bari Mantis,  MD

## 2020-12-03 NOTE — ED Notes
Pt presents to [facility] after an episode of vomiting that led to a popping sensation and increased swelling in his left clavicle. Pt recently had his clavicle set at [facility] following a work accident and was prescribed oxycodone for pain control at home. Pt reports that he has been unable to tolerate the oxycodone d/t vomiting following taking the medication. Pt has tried zofran w/o relief in symptoms. Pt states that he is able to tolerate PO intake and is only vomiting w/ the medication. Pt reports his vomit looks like "orange jello". Pt states that he has been unable to have a bowel movement since Tuesday and has tried taking miralax w/o success. Pt denies abd pain and states he is able to pass gas.    Medical History:   Diagnosis Date    Inguinal hernia      All belongings left in care of the patient.

## 2020-12-04 NOTE — ED Notes
Pt A&Ox4 and given discharge instructions and follow up information. Pt verbalizes understanding and has no further questions or complaints. VSS. Pt ambulated out of ED w/ steady gait to go home by POV w/ friend.

## 2020-12-07 ENCOUNTER — Encounter: Admit: 2020-12-07 | Discharge: 2020-12-07 | Payer: BC Managed Care – PPO

## 2020-12-07 NOTE — Telephone Encounter
Form completed "Physician Statement" patient left in clinic for STD claim.  Emailed completed paperwork to [email or web address]   Attempted to inform patient by phone but did not answer and unable to leave message.  Will share note with patient through mychart portal.

## 2020-12-14 ENCOUNTER — Ambulatory Visit: Admit: 2020-12-14 | Discharge: 2020-12-14 | Payer: BC Managed Care – PPO

## 2020-12-14 ENCOUNTER — Encounter: Admit: 2020-12-14 | Discharge: 2020-12-14 | Payer: BC Managed Care – PPO

## 2020-12-14 MED ORDER — HYDROCODONE-ACETAMINOPHEN 5-325 MG PO TAB
1-2 | ORAL_TABLET | ORAL | 0 refills | 15.00000 days | Status: DC | PRN
Start: 2020-12-14 — End: 2020-12-14

## 2020-12-14 MED ORDER — FLUTICASONE PROPIONATE 50 MCG/ACTUATION NA SPSN
2 | Freq: Every day | NASAL | 3 refills | 60.00000 days | Status: AC
Start: 2020-12-14 — End: ?

## 2020-12-14 MED ORDER — HYDROCODONE-ACETAMINOPHEN 5-325 MG PO TAB
1-2 | ORAL_TABLET | ORAL | 0 refills | 30.00000 days | Status: AC | PRN
Start: 2020-12-14 — End: ?
  Filled 2020-12-14: qty 30, 4d supply, fill #1

## 2020-12-14 MED ORDER — LORATADINE 10 MG PO TAB
10 mg | ORAL_TABLET | Freq: Every morning | ORAL | 1 refills | 28.00000 days | Status: AC
Start: 2020-12-14 — End: ?

## 2020-12-14 NOTE — Assessment & Plan Note
Counseled on pain relieving techniques when he needs to cough or sneeze.  Recommend allowing sneeze to escape and using a pillow over the clavicle area to brace it.  We will also prescribe Claritin and Flonase to decrease frequency of sneezing during allergy outbreaks.    Will obtain CT today to determine if the [facility] joint is acceptably reduced.  We will call him after his scan to outline restrictions on his left upper extremity

## 2020-12-14 NOTE — Progress Notes
CC: Post-op visit    HPI:  Problem   Traumatic Dislocation of Left Clavicle    Closed reduced in OR 11/29/20       He is having significant throbbing and discomfort in his left clavicle and neck.  He reports that he presented to the ER after a bout of vomiting that caused a pop in the area of his dislocation.  The emergency department stated that his x-rays showed a reduced SC joint.  He has had increased pain since that time.    Physical Exam   Height 165.1 cm (5\' 5" ), weight 61.2 kg (135 lb).   Body mass index is 22.47 kg/m.    Left upper extremity: There is a visible and palpable deformity in his left SC joint when compared to the right.  This is improved from his preoperative state.  He is neurovascularly intact distally.    Assessment & Plan:      Traumatic dislocation of left clavicle  Counseled on pain relieving techniques when he needs to cough or sneeze.  Recommend allowing sneeze to escape and using a pillow over the clavicle area to brace it.  We will also prescribe Claritin and Flonase to decrease frequency of sneezing during allergy outbreaks.    Will obtain CT today to determine if the [facility] joint is acceptably reduced.  We will call him after his scan to outline restrictions on his left upper extremity    CT reviewed with Heddings.  Acceptable alignment of SC joint with small degree of anterior dislocation.    Called pt to discuss results.  OK for PROM of left shoulder for abduction and FF only. No adduction past midline or internal rotation.  Fu in 2 weeks for repeat exam and xrays

## 2020-12-20 ENCOUNTER — Encounter: Admit: 2020-12-20 | Discharge: 2020-12-20 | Payer: BC Managed Care – PPO

## 2020-12-20 MED ORDER — HYDROCODONE-ACETAMINOPHEN 5-325 MG PO TAB
1 | ORAL_TABLET | ORAL | 0 refills | 30.00000 days | Status: AC | PRN
Start: 2020-12-20 — End: ?

## 2020-12-20 MED ORDER — HYDROCODONE-ACETAMINOPHEN 5-325 MG PO TAB
1-2 | ORAL_TABLET | ORAL | 0 refills | PRN
Start: 2020-12-20 — End: ?

## 2020-12-27 ENCOUNTER — Encounter: Admit: 2020-12-27 | Discharge: 2020-12-27 | Payer: BC Managed Care – PPO

## 2020-12-27 DIAGNOSIS — S43102D Unspecified dislocation of left acromioclavicular joint, subsequent encounter: Secondary | ICD-10-CM

## 2020-12-28 ENCOUNTER — Encounter: Admit: 2020-12-28 | Discharge: 2020-12-28 | Payer: BC Managed Care – PPO

## 2020-12-28 ENCOUNTER — Ambulatory Visit: Admit: 2020-12-28 | Discharge: 2020-12-28 | Payer: BC Managed Care – PPO

## 2020-12-28 DIAGNOSIS — S43102D Unspecified dislocation of left acromioclavicular joint, subsequent encounter: Secondary | ICD-10-CM

## 2020-12-28 DIAGNOSIS — K4090 Unilateral inguinal hernia, without obstruction or gangrene, not specified as recurrent: Secondary | ICD-10-CM

## 2020-12-28 DIAGNOSIS — S43215A Anterior dislocation of left sternoclavicular joint, initial encounter: Secondary | ICD-10-CM

## 2020-12-28 MED ORDER — METHOCARBAMOL 500 MG PO TAB
1000 mg | ORAL_TABLET | Freq: Three times a day (TID) | ORAL | 1 refills | Status: CN
Start: 2020-12-28 — End: ?

## 2020-12-28 MED ORDER — METHOCARBAMOL 500 MG PO TAB
1000 mg | ORAL_TABLET | Freq: Three times a day (TID) | ORAL | 0 refills | Status: DC
Start: 2020-12-28 — End: 2020-12-28
  Filled 2020-12-30: qty 60, 10d supply, fill #1

## 2020-12-28 NOTE — Progress Notes
CC: Post-op visit    HPI:  Problem   Anterior Dislocation of Left Sternoclavicular Joint     Persistent pain in neck.  Does not recall [place] talking about stretching the neck at last visit. Ran out of hydrocodone.  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     Physical Exam   Height 165.1 cm (5\' 5" ), weight 61.2 kg (135 lb).   Body mass index is 22.47 kg/m.    Left upper extremity: There is a visible and palpable deformity in his left SC joint when compared to the right.  This is improved from his preoperative state.  He is neurovascularly intact distally.    Radiology: 3 views left clavicle ordered and reviewedtoday demonstrate no change since last xray. Small degree of dislocation present at [facility]       Assessment & Plan:      Anterior dislocation of left sternoclavicular joint  Continue PROM of shoulder. Do not cross midline with the arm.  For neck pain:   -stretching  -massage  -referral to pain clinic for evaluation of trigger point injections or other treatment  -PT orders  -OTC voltaren gel to muscles daily  -Ibuprofen 800mg  2x/day (if using the voltaren. 3x/day if not)  -Tylenol 1000mg  3x/day  -Methocarbomol for muscle relaxant    Fu in 2 weeks  -

## 2020-12-28 NOTE — Assessment & Plan Note
Continue PROM of shoulder. Do not cross midline with the arm.  For neck pain:   -stretching  -massage  -referral to pain clinic for evaluation of trigger point injections or other treatment  -PT orders  -OTC voltaren gel to muscles daily  -Ibuprofen 800mg  2x/day (if using the voltaren. 3x/day if not)  -Tylenol 1000mg  3x/day  -Methocarbomol for muscle relaxant    Fu in 2 weeks  -

## 2020-12-29 ENCOUNTER — Encounter: Admit: 2020-12-29 | Discharge: 2020-12-29 | Payer: BC Managed Care – PPO

## 2020-12-30 ENCOUNTER — Encounter: Admit: 2020-12-30 | Discharge: 2020-12-30 | Payer: BC Managed Care – PPO

## 2021-01-01 ENCOUNTER — Encounter: Admit: 2021-01-01 | Discharge: 2021-01-01 | Payer: BC Managed Care – PPO

## 2021-01-04 ENCOUNTER — Ambulatory Visit: Admit: 2021-01-04 | Discharge: 2021-01-04 | Payer: BC Managed Care – PPO

## 2021-01-04 ENCOUNTER — Encounter: Admit: 2021-01-04 | Discharge: 2021-01-04 | Payer: BC Managed Care – PPO

## 2021-01-04 DIAGNOSIS — M542 Cervicalgia: Secondary | ICD-10-CM

## 2021-01-04 DIAGNOSIS — K4090 Unilateral inguinal hernia, without obstruction or gangrene, not specified as recurrent: Secondary | ICD-10-CM

## 2021-01-04 DIAGNOSIS — S43102A Unspecified dislocation of left acromioclavicular joint, initial encounter: Secondary | ICD-10-CM

## 2021-01-04 DIAGNOSIS — S43215A Anterior dislocation of left sternoclavicular joint, initial encounter: Secondary | ICD-10-CM

## 2021-01-04 MED ORDER — METHYLPREDNISOLONE ACETATE 40 MG/ML IJ SUSP
20 mg | Freq: Once | INTRA_ARTICULAR | 0 refills | Status: CP | PRN
Start: 2021-01-04 — End: ?
  Administered 2021-01-04: 13:00:00 20 mg via INTRA_ARTICULAR

## 2021-01-04 MED ORDER — BUPIVACAINE (PF) 0.5 % (5 MG/ML) IJ SOLN
4 mL | Freq: Once | INTRAMUSCULAR | 0 refills | Status: CP | PRN
Start: 2021-01-04 — End: ?
  Administered 2021-01-04: 13:00:00 4 mL via INTRAMUSCULAR

## 2021-01-04 NOTE — Procedures
Attending Surgeon: Bella Kennedy, MD    Anesthesia: Local    Pre-Procedure Diagnosis:   1. Myofascial neck pain        Post-Procedure Diagnosis:   1. Myofascial neck pain        Pain Score: Seven    Trigger Point  Locations: L upper trapezius and L cervical/thoracic/lumbar paraspinal  Consent:   Consent obtained: written  Consent given by: patient  Alternatives discussed: referral, delayed treatment and alternative treatment     Universal Protocol:  Relevant documents: relevant documents present and verified  Imaging studies: imaging studies available  Required items: required blood products, implants, devices, and special equipment available  Patient identity confirmed: Patient identify confirmed verbally with patient.        Time out: Immediately prior to procedure a "time out" was called to verify the correct patient, procedure, equipment, support staff and site/side marked as required      Procedures Details:   Indications: cervicalgia and painPrep: 2% chlorhexidine  Needle size: 27 G  Number of muscles: 1 or 2  Approach: lateral  Medications administered: 20 mg methylPREDNISolone ACETATE 40 mg/mL; 4 mL bupivacaine PF 0.5 %  Patient tolerance: Patient tolerated the procedure well with no immediate complications. Pressure was applied, and hemostasis was accomplished.          Estimated blood loss: none or minimal  Specimens: none  Patient tolerated the procedure well with no immediate complications. Pressure was applied, and hemostasis was accomplished.

## 2021-01-04 NOTE — Progress Notes
Dear Joylene Igo,    I appreciate your kind referral of Alexis Le for evaluation of pain. Please see my note below for the full details of the evaluation and management plan.    Thank you,    Bella Kennedy, MD        [facility]  NEW PATIENT HISTORY AND PHYSICAL  Subjective     Chief Complaint:   Chief Complaint   Patient presents with   ? New Patient       HPI: Alexis Le is a 30 y.o. male who  has a past medical history of Inguinal hernia. who presents for evaluation. He was referred to [place] from Orthopedic Surgery for evaluation of his neck pain.    Patient is a 30 year old male presents for evaluation for neck pain after traumatic injury a month ago at work on 09/13.  Patient states he was working under a car and a heavy part fell on his left chest resulting in left anterior sternoclavicular joint dislocation with small clavicular head fracture.  This required a closed reduction of left anterior sternoclavicular ankle joint under anesthesia. Patient states that he has been having progressively worse neck pain over the past few weeks. Pain worsens with activity and improves with rest. Describes the pain as a constant     He has been following with ortho who has referred him to physical therapy. He states that he has not been able to get into physical therapy yet. He is taking ibuprofen, tylenol, hydrocodone with some relief. He has also been taking Robaxin with slightly more relief. States that his pain limits his daily activities.           PRIOR MEDICATIONS:   Effective  APAP  NSAIDs  Opioids  Robaxin    Ineffective      Unable to tolerate      Never  Gabapentin   Lyrica  Ami/Nortriptyline  Cymbalta  Tizanidine  NSAID  Acetaminophen    PRIOR INTERVENTIONS:   Effective      Ineffective          Wille Glaser denies any recent fevers, chills, infection, antibiotics, bowel or bladder incontinence, saddle anesthesia, bleeding issues, or recent anticoagulant.     ROS: All 14 systems reviewed and found to be negative except as above and as follows.    Past Medical History:  Medical History:   Diagnosis Date   ? Inguinal hernia        Family History:  No family history on file.    Social History:  Lives in [place] [address]    Social History     Socioeconomic History   ? Marital status: Single   Tobacco Use   ? Smoking status: Never Smoker   ? Smokeless tobacco: Never Used   Substance and Sexual Activity   ? Alcohol use: Not Currently   ? Drug use: Not Currently     Types: Marijuana     Comment: daily   ? Sexual activity: Yes     Partners: Female       Allergies:  No Known Allergies    Medications:    Current Outpatient Medications:   ?  acetaminophen (MAPAP (ACETAMINOPHEN)) 325 mg tablet, Take two tablets by mouth every 6 hours as needed., Disp: , Rfl: 0  ?  divalproex (DEPAKOTE ER) 500 mg ER tablet, Take one tablet by mouth twice daily. Take with food., Disp: 120  tablet, Rfl: 1  ?  fluticasone propionate (FLONASE ALLERGY RELIEF) 50 mcg/actuation nasal spray, suspension, Apply two sprays to each nostril as directed daily. Shake bottle gently before using., Disp: 48 g, Rfl: 3  ?  hydrocortisone 1 % topical cream, Apply  topically to affected area daily. Indications: eczema, Disp: , Rfl:   ?  loratadine (CLARITIN) 10 mg tablet, Take one tablet by mouth every morning., Disp: 90 tablet, Rfl: 1  ?  methocarbamoL (ROBAXIN) 500 mg tablet, Take two tablets by mouth three times daily., Disp: 60 tablet, Rfl: 1  ?  naloxone (NARCAN) 4 mg/actuation nasal spray, Insert 1 spray into 1 nostril as needed for signs of opioid overdose then call 911. May repeat dose every 2-3 minutes (alternate nostrils) until medical team arrives.  Indications: opioid overdose, decrease in rate & depth of breathing due to opioid drug, Disp: 2 each, Rfl: 0  ?  ondansetron HCL (ZOFRAN) 4 mg tablet, Take one tablet by mouth every 6 hours as needed for Nausea or Vomiting., Disp: 10 tablet, Rfl: 0  ?  polyethylene glycol 3350 (MIRALAX) 17 g packet, Take one packet by mouth daily. Take while using narcotic pain medication to prevent constipation., Disp: 12 packet, Rfl:   ?  senna/docusate (SENOKOT-S) 8.6/50 mg tablet, Take one tablet by mouth daily. Take while using narcotic pain medication to prevent constipation., Disp: , Rfl:     Physical examination:   BP (P) 108/66  - Pulse 69  - Ht 165.1 cm (5' 5)  - Wt 61.2 kg (135 lb)  - SpO2 98%  - BMI 22.47 kg/m?   Pain Score: Seven       General: The patient is a well-developed, well nourished 30 y.o. male in no acute distress.   HEENT: Head is normocephalic and atraumatic. EOMI bilaterally.   Pulmonary: The patient has unlabored respirations and bilateral symmetric chest excursion.   Extremities: No clubbing, cyanosis, or edema.     Neurologic:   The patient is alert and oriented.     Musculoskeletal:   Gait is normal.  Multiple tight bands of muscle in the posterior neck and trapezius muscles bilaterally.  Bruising along left anterolateral chest.      Last Cr and LFT's:  Creatinine   Date Value Ref Range Status   11/29/2020 0.70 0.4 - 1.24 MG/DL Final     AST (SGOT)   Date Value Ref Range Status   11/28/2020 19 7 - 40 U/L Final     ALT (SGPT)   Date Value Ref Range Status   11/28/2020 14 7 - 56 U/L Final     Alk Phosphatase   Date Value Ref Range Status   11/28/2020 48 25 - 110 U/L Final     Total Bilirubin   Date Value Ref Range Status   11/28/2020 0.4 0.3 - 1.2 MG/DL Final          Assessment:    Alexis Le is a 30 y.o. male who  has a past medical history of Inguinal hernia. who presents for evaluation of pain.    The pain complaints are most likely due to:    1. Myofascial neck pain  [place] AMB SPINE TRIGGER POINT INJECTION    C SPINE 3 VIEWS OR LESS    Trigger Point    [place] AMB SPINE TRIGGER POINT INJECTION   2. Traumatic dislocation of left clavicle, initial encounter     3. Anterior dislocation of left sternoclavicular joint, initial encounter  Plan:    1. Trigger point injections to the left neck and shoulder were performed today.  2. Will obtain X-rays of cervical spine today  2. Reinforced integral role of physical therapy as prescribed by Dr. Cherene Julian' office, encouraged patient to attempt to schedule   3. Follow up with Delaney Meigs in 1 month    Risks/benefits of all pharmacologic and interventional treatments discussed and questions answered.     Thank you for this kind referral for consultation. Please feel free to contact me with any questions or concerns.     ATTESTATION    I personally performed the key portions of the E/M visit, discussed case with resident and concur with resident documentation of history, physical exam, assessment, and treatment plan unless otherwise noted.    Staff name:  Bella Kennedy, MD Date:  01/04/2021

## 2021-01-05 ENCOUNTER — Encounter: Admit: 2021-01-05 | Discharge: 2021-01-05 | Payer: BC Managed Care – PPO

## 2021-01-05 NOTE — Telephone Encounter
Pt called requesting assistance getting scheduled with PT. Pt would like to be seen at [facility] location. tx pt to [phone] and instructed him to ask for outpatient PT scheduling at the main campus medical office building.

## 2021-01-10 ENCOUNTER — Encounter: Admit: 2021-01-10 | Discharge: 2021-01-10 | Payer: BC Managed Care – PPO

## 2021-01-10 DIAGNOSIS — S43215A Anterior dislocation of left sternoclavicular joint, initial encounter: Secondary | ICD-10-CM

## 2021-01-11 ENCOUNTER — Ambulatory Visit: Admit: 2021-01-11 | Discharge: 2021-01-11 | Payer: BC Managed Care – PPO

## 2021-01-11 ENCOUNTER — Encounter: Admit: 2021-01-11 | Discharge: 2021-01-11 | Payer: BC Managed Care – PPO

## 2021-01-11 DIAGNOSIS — S43215D Anterior dislocation of left sternoclavicular joint, subsequent encounter: Secondary | ICD-10-CM

## 2021-01-11 DIAGNOSIS — S43215A Anterior dislocation of left sternoclavicular joint, initial encounter: Secondary | ICD-10-CM

## 2021-01-11 MED ORDER — METHOCARBAMOL 500 MG PO TAB
1000 mg | ORAL_TABLET | Freq: Three times a day (TID) | ORAL | 1 refills | Status: AC
Start: 2021-01-11 — End: ?
  Filled 2021-01-11: qty 60, 10d supply, fill #1

## 2021-01-11 NOTE — Progress Notes
Start time:  11:56   End time:  12:10    S:  Alexis Le underwent closed reduction of a left sternoclavicular joint on 11/29/20.  He states his left SC joints looks a lot bigger today. He states when he coughs his SC joint pops and where he gets this popping is along his chest.  He c/o pain over costochondral junction of his left chest. Most his pain is coming from along this trapezium.  Of note this is a work related injury.     XR:  Left clavicle.xrays ordered and reviewed today demonstrate his left SC joint is slightly superior to his right SC joint but I think this is just the angle the xray was taken today.     PE:  The left SC joint is notably prominent.   He can ABD his arm to about 90 degrees.  He can forward flex his shoulder only 30 degrees.  When he does circumduct his arm I do not detect any popping or catching in left SC joint.  Very TTP along costochondral junction at about level of 4th - 7th ribs.     A/P:  Closed reduction of a left SC joint dislocation with sig anterior prominence of his left SC joint as well as pain along costochondral junction of left hemithorax.      -Will repeat CT scan of his left chest and SC joint to evaluate for reduction of his SC joint and evaluate costochondral injury   -In meanwhile he can be ROMAT of his LUE  -2lb weight limit LUE  -No laying on left side  -continue work light duty  -follow up 4 weeks to see how he is doing  -physical therapy ordered to work on ROM of shoulder  -Refilled Robaxin today      Due to COVID-19 pandemic, I have taken down these notes in presence of Odis Hollingshead, MD using [employer]

## 2021-02-12 ENCOUNTER — Encounter: Admit: 2021-02-12 | Discharge: 2021-02-12 | Payer: Commercial Managed Care - Pharmacy Benefit Manager

## 2021-02-12 DIAGNOSIS — S43215D Anterior dislocation of left sternoclavicular joint, subsequent encounter: Secondary | ICD-10-CM

## 2021-02-13 ENCOUNTER — Emergency Department: Admit: 2021-02-13 | Discharge: 2021-02-13 | Payer: Commercial Managed Care - Pharmacy Benefit Manager

## 2021-02-13 ENCOUNTER — Encounter: Admit: 2021-02-13 | Discharge: 2021-02-13 | Payer: Commercial Managed Care - Pharmacy Benefit Manager

## 2021-02-13 ENCOUNTER — Ambulatory Visit: Admit: 2021-02-13 | Discharge: 2021-02-13 | Payer: Commercial Managed Care - Pharmacy Benefit Manager

## 2021-02-13 DIAGNOSIS — S43102A Unspecified dislocation of left acromioclavicular joint, initial encounter: Secondary | ICD-10-CM

## 2021-02-13 DIAGNOSIS — S43215D Anterior dislocation of left sternoclavicular joint, subsequent encounter: Secondary | ICD-10-CM

## 2021-02-13 DIAGNOSIS — K4090 Unilateral inguinal hernia, without obstruction or gangrene, not specified as recurrent: Secondary | ICD-10-CM

## 2021-02-13 DIAGNOSIS — R569 Unspecified convulsions: Secondary | ICD-10-CM

## 2021-02-13 DIAGNOSIS — G40909 Epilepsy, unspecified, not intractable, without status epilepticus: Secondary | ICD-10-CM

## 2021-02-13 LAB — COVID-19 (SARS-COV-2) PCR

## 2021-02-13 LAB — POC LACTATE: LACTIC ACID POC: 1.6 MMOL/L (ref 0.5–2.0)

## 2021-02-13 LAB — AMPHETAMINES-URINE RANDOM: AMPHETAMINES: NEGATIVE

## 2021-02-13 LAB — FENTANYL URINE: FENTANYL URINE: NEGATIVE

## 2021-02-13 LAB — COCAINE-URINE RANDOM: COCAINE: NEGATIVE

## 2021-02-13 LAB — BARBITURATES-URINE RANDOM: BARBITURATES: NEGATIVE

## 2021-02-13 LAB — OPIATES-URINE RANDOM: OPIATES: NEGATIVE

## 2021-02-13 LAB — INFLUENZA A/B AND RSV PCR
FLU A: NEGATIVE
FLU B: NEGATIVE
RSV: NEGATIVE

## 2021-02-13 LAB — PHENCYCLIDINES-URINE RANDOM: PHENCYCLIDINE (PCP): NEGATIVE

## 2021-02-13 LAB — CANNABINOIDS-URINE RANDOM: CANNABINOIDS (THC): POSITIVE — AB

## 2021-02-13 LAB — BENZODIAZEPINES-URINE RANDOM: BENZODIAZEPINES: NEGATIVE

## 2021-02-13 MED ORDER — PROCHLORPERAZINE EDISYLATE 5 MG/ML IJ SOLN
10 mg | Freq: Once | INTRAVENOUS | 0 refills | Status: CP
Start: 2021-02-13 — End: ?
  Administered 2021-02-13: 18:00:00 10 mg via INTRAVENOUS

## 2021-02-13 MED ORDER — SODIUM CHLORIDE 0.9 % IV SOLP
1000 mL | Freq: Once | INTRAVENOUS | 0 refills | Status: CP
Start: 2021-02-13 — End: ?
  Administered 2021-02-13: 18:00:00 1000 mL via INTRAVENOUS

## 2021-02-13 MED ORDER — DIPHENHYDRAMINE HCL 50 MG/ML IJ SOLN
25 mg | Freq: Once | INTRAVENOUS | 0 refills | Status: CP
Start: 2021-02-13 — End: ?
  Administered 2021-02-13: 18:00:00 25 mg via INTRAVENOUS

## 2021-02-13 MED ORDER — ONDANSETRON HCL (PF) 4 MG/2 ML IJ SOLN
4 mg | INTRAVENOUS | 0 refills | Status: DC | PRN
Start: 2021-02-13 — End: 2021-02-13

## 2021-02-13 NOTE — ED Provider Notes
Alexis Le is a 30 y.o. male.    Chief Complaint:  Chief Complaint   Patient presents with   ? Seizure     Pt reports he had two unwitnessed seizures one in the middle of the night unknown time and another this morning @ 0400. Pt reports hx of seizures and does take Depakote. Pt reports lethargy, nausea and vomiting. Pt reports he fell out of bed but unsure if he hit his head. Pt not on blood thinners        History of Present Illness:  Alexis Le is a 30 year old gentleman with a history of R inguinal hernia (s/p surgical repair 2019), left clavicle fracture (s/p surgical repair 2022), and past seizure who presented to the ED from Orthopedics Clinic with reported seizure and post-ictal AMS as well as subjective fever, chills, headache, nausea, and vomiting since this morning (02/13/21).    He states that he was at work yesterday working around [employer] fumes, and he felt that his respirator was not forming a good seal around his face. He felt lightheaded and definitely thought he inhaled fumes. He has been on Depakote 500 mg BID since 2020 (prescribed by Neurology); however, he has not followed up since this was prescribed. Because his prescription refills were running out, he decided to switch to taking one pill (500 mg) daily instead of BID to make them last longer. He had never had any seizures since 2020. This morning, he woke up on the floor feeling very hazy and confused with a headache, similar to how he felt after his last prior seizure in 2020. He denies tongue biting or urinary incontinence. Suspected seizure event was not witnessed. Since this morning, he has felt subjectively febrile, with headache (reportedly this happened last time he had a seizure), and bilious nausea/vomiting. He denies recent flu/URI symptoms or exposures. He denies neck pain apart from his post-surgical clavicle pain. He denies D/C, CP, SOB, abd pain, weakness, numbness, rash, or joint pains. He says that nausea, vomiting, and headache occurred the last time he had a seizure.    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 He states that he has not used benzos or any other drugs since that time. He does not drink alcohol.    While workup was ongoing, patient opted to leave AMA. Risks of early discharge were discussed, including worsening of seizure condition resulting in possible death. He voiced understanding of risks and agreed to return to the emergency room if his condition worsened. He also stated that he would contact his former Neurologist by MyChart for a follow up appointment and possible refill of his seizure meds.          Review of Systems:  Review of Systems   Constitutional: Positive for chills, fatigue and fever.        Fever not appreciated on exam, VS.   HENT: Negative for congestion, rhinorrhea, sinus pain and sore throat.    Eyes: Positive for photophobia. Negative for visual disturbance.   Respiratory: Negative for cough and shortness of breath.    Cardiovascular: Negative for chest pain, palpitations and leg swelling.   Gastrointestinal: Positive for nausea and vomiting. Negative for abdominal distention, abdominal pain, blood in stool, constipation and diarrhea.   Endocrine: Negative for polyuria.   Genitourinary: Negative for dysuria and hematuria.   Musculoskeletal: Negative for arthralgias, back pain, neck pain and neck stiffness.   Skin: Negative for rash and wound.  Neurological: Positive for seizures, syncope and light-headedness. Negative for dizziness and numbness.   Psychiatric/Behavioral: Positive for confusion. Negative for agitation and behavioral problems.         Allergies:  Patient has no known allergies.      Past Medical History:  Medical History:   Diagnosis Date   ? Inguinal hernia    ? Seizure disorder [facility])        Past Surgical History:  Surgical History:   Procedure Laterality Date   ? OPEN RIGHT INGUINAL HERNIA REPAIR WITH MESH Right 01/09/2018    Performed by Janine Ores, DO at [facility] OR   ? CLOSED TREATMENT CLAVICULAR FRACTURE Left 11/29/2020    Performed by Heddings, Revonda Standard, MD at [facility] OR       Pertinent medical/surgical history reviewed    Social History:  Social History     Tobacco Use   ? Smoking status: Never   ? Smokeless tobacco: Never   Substance Use Topics   ? Alcohol use: Not Currently   ? 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     Social History     Substance and Sexual Activity   Drug Use Yes   ? 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             Family History:  History reviewed. No pertinent family history.    Vitals:  ED Vitals    Date and Time T BP P RR SPO2P SPO2 User   02/13/21 1100 -- 123/69 -- -- 54 99 % CR   02/13/21 1030 -- 125/54 -- -- 62 99 % CR   02/13/21 0943 37 ?C (98.6 ?F) 121/76 79 -- -- 98 % JF   02/13/21 0941 37.1 ?C (98.7 ?F) 115/73 -- 18 PER MINUTE 60 99 % RC          Physical Exam:  Physical Exam  Constitutional:       Appearance: Normal appearance. He is not ill-appearing, toxic-appearing or diaphoretic.   HENT:      Head: Normocephalic and atraumatic.      Right Ear: External ear normal.      Left Ear: External ear normal.      Nose: Nose normal. No congestion.      Mouth/Throat:      Mouth: Mucous membranes are moist.      Pharynx: Oropharynx is clear. No posterior oropharyngeal erythema.   Eyes:      General: No scleral icterus.     Extraocular Movements: Extraocular movements intact.      Conjunctiva/sclera: Conjunctivae normal.      Pupils: Pupils are equal, round, and reactive to light.   Cardiovascular:      Rate and Rhythm: Normal rate and regular rhythm.      Pulses: Normal pulses.      Heart sounds: Normal heart sounds.   Pulmonary:      Effort: Pulmonary effort is normal. No respiratory distress.      Breath sounds: Normal breath sounds.   Abdominal:      General: Bowel sounds are normal. There is no distension. Palpations: Abdomen is soft.      Tenderness: There is no abdominal tenderness. There is no guarding.   Musculoskeletal:         General: No swelling or deformity.      Cervical back: Neck  supple. No rigidity or tenderness.   Skin:     General: Skin is warm and dry.      Coloration: Skin is not jaundiced.      Findings: No rash.   Neurological:      General: No focal deficit present.      Mental Status: He is alert and oriented to person, place, and time.      Cranial Nerves: No cranial nerve deficit.      Sensory: No sensory deficit.   Psychiatric:         Mood and Affect: Mood normal.         Behavior: Behavior normal.         Laboratory Results:  Labs Reviewed   CULTURE-BLOOD W/SENSITIVITY   CULTURE-BLOOD W/SENSITIVITY   CULTURE-URINE W/SENSITIVITY   COVID-19 (SARS-COV-2) PCR   INFLUENZA A/B AND RSV PCR   POC LACTATE       Result Value Ref Range Status    LACTIC ACID POC 1.6  0.5 - 2.0 MMOL/L Final   CBC AND DIFF   COMPREHENSIVE METABOLIC PANEL   AMPHETAMINES-URINE RANDOM   BARBITURATES-URINE RANDOM   BENZODIAZEPINES-URINE RANDOM   CANNABINOIDS-URINE RANDOM   COCAINE-URINE RANDOM   OPIATES-URINE RANDOM   PHENCYCLIDINES-URINE RANDOM   FENTANYL URINE   SED RATE   C REACTIVE PROTEIN (CRP)   VALPROIC ACID LEVEL   POC LACTATE   POC LACTATE          Radiology Interpretation:  CHEST SINGLE VIEW   Final Result      No acute cardiopulmonary process.          Finalized by Delmar Landau, M.D. on 02/13/2021 12:57 PM. Dictated by Delmar Landau, M.D. on 02/13/2021 12:56 PM.         CT HEAD WO CONTRAST    (Results Pending)       EKG:  ECG Results          ECG 12-LEAD (Final result)      Collection Time Result Time VT RATE P-R Interval QRS DURATION Q-T Interval QTC Calc Bazett    02/13/21 09:51:07 02/13/21 11:48:39 68 134 100 370 393         Collection Time Result Time P Axis R Axis T Axis    02/13/21 09:51:07 02/13/21 11:48:39 61 44 38               Final result                 Impression:    Sinus rhythm with premature atrial complexes  Otherwise normal ECG  When compared with ECG of 28-Nov-2020 19:55,  Previous ECG has undetermined rhythm, needs review  Incomplete right bundle branch block is no longer present  Confirmed by Santa Lighter [phone]) on 02/13/2021 11:48:38 AM                              ED Course:  ED Course as of 02/13/21 1304   Tue Feb 13, 2021   1147 Ilir Guyot is a 30 y.o. male with history as above presenting for complaint of seizure overnight (after exposure to pain fumes at work and following self-taper down on his PTA Depakote) followed by subjective fever, chills, headache, nausea, and vomiting. IV access was established and patient was placed on telemetry monitoring. Available records were reviewed. Vital signs and physical exam as documented above. Not febrile; VS not c/f Cushing's reflex.  Differential diagnosis at this time includes but is not limited to organic seizure following paint fume exposure and decreased anticonvulsant dose; however, less likely infectious etiology (e.g., meningitis) and head trauma should be excluded.    Laboratory evaluation and imaging workup were initiated, including CT head without contrast, CBC, CMP, ESR/CRP, UDS, UA, CXR, blood cultures, Depakote level ordered. Patient insisted on leaving AMA prior to completion of evaluation. He voiced understanding of risks (see HPI) and agreed to return to the ED if his condition worsened and said that he would contact his former Neurologist by MyChart for a follow up evaluation and refill of his meds.   [WW]      ED Course User Index  [WW] Alyson Ingles, MD       ED Scoring:                                Coding      Facility Administered Meds:  Medications   sodium chloride 0.9 %   infusion (1,000 mL Intravenous Given - New Bag 02/13/21 1221)   diphenhydrAMINE HCL (BENADRYL) injection 25 mg (25 mg Intravenous Given 02/13/21 1221)   prochlorperazine (COMPAZINE) injection 10 mg (10 mg Intravenous Given 02/13/21 1221)         Active Problem List/Diagnosis Related to Current Presentation:  Seizure.      Clinical Impression:  Clinical Impression   Seizure [facility])       Disposition/Follow up  ED Disposition     ED Disposition   AMA           Emergency Department: [facility], [facility]  [address]  [address]  [place] [address]  [phone]  Go to   If symptoms worsen    Neurology  Contact your former Neurologist for an evaluation in clinic and refills for your anticonvulsant medication.  Schedule an appointment as soon as possible for a visit   As needed      Medications:  New Prescriptions    No medications on file       Procedure Notes:  Procedures      Attestation / Supervision:  I personally performed the E/M including history, physical exam, and MDM.      I will discuss this plan with my attending physician on service, Dr. Jon Gills.    Dionne Ano. Hyman Hopes, M.D., Ph.D.  Internal Medicine & Psychiatry  Available on [facility] - Pager [phone]

## 2021-02-13 NOTE — ED Notes
Pt requesting to leave AMA. Dr Jon Gills and Dr Hyman Hopes notified. Dr Hyman Hopes to bedside.

## 2021-02-13 NOTE — Progress Notes
Start time:  8:45   End time:  8:51    S:  Alexis Le underwent closed reduction of a left SC joint dislocation on 11/29/20.  He returns today approx 11 weeks out.  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  This is affecting his mental status.  He doesn't know who prescribed this medication for him.      PE:  Symmetric ROm in all planes.  He does have prominence over his anterior sternoclavicular joint.     XR:  Left shoulder xrays ordered and reviewed today demonstrate slight superior elevation of left clavicular head compared to the right.      A/P:  11 weeks s/p left anterior SC joint dislocation    He appears to have subluxed anteriorly but he functioning very well as we would expect for this type of injury.    I can't get a full understanding of [name] on how he is getting back to preinjury function due to issues with his Depakote.    For this reason I would like to see him back in 4 weeks.    From shoulder standpoint, he can continue light duty at work as he was doing.       Due to COVID-19 pandemic, I have taken down these notes in presence of Odis Hollingshead, MD using [employer]

## 2021-02-13 NOTE — ED Notes
Dr Hyman Hopes to bedside to speak with Pt. Pt provided AMA paperwork and signed. Verbalized understanding of leaving AMA. Pt ambulated out of ED with all belongings. PIV removed.

## 2021-02-13 NOTE — Progress Notes
Rapid response called at 0900 for pt c/o mental status changes including memory loss, anxiety/feeling emotional, and 'feeling lost'. Pt with h/o seizures on Depakote. Pt does not appear in acute respiratory distress. Pt endorses and strong headache and dizziness as well.    RR arrived 0915- pt assessed and transported to ER by rapid response team.

## 2021-02-13 NOTE — Response Teams
Rapid Response Team Progress Note    Date: 02/13/2021 Time: 9:52 AM  Patient: Alexis Le  Attending: No att. providers found Service: Emergency Medicine  Admission Date: 02/13/2021  LOS: 0 days    A Code/Rapid Response Timeline Event Report has been created for this patient on 02/13/21 at 0901. Patient in ortho clinic for routine appointment regarding clavicle injury. When patient arrived to clinic he reported severe headache, dizziness and feeling "not like himself" with symptoms starting in the middle of the night. Rapid response activated for concern of altered mental status. Patient reports history of seizures, but unsure of last seizure date. 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 Patient transferred to ED via wheelchair with RRT RN and ED tech for further evaluation and treatment.       Leo Rod, RN

## 2021-02-18 ENCOUNTER — Encounter: Admit: 2021-02-18 | Discharge: 2021-02-18 | Payer: Commercial Managed Care - Pharmacy Benefit Manager

## 2021-02-19 ENCOUNTER — Emergency Department: Admit: 2021-02-19 | Discharge: 2021-02-19

## 2021-02-19 ENCOUNTER — Encounter: Admit: 2021-02-19 | Discharge: 2021-02-19

## 2021-02-19 ENCOUNTER — Emergency Department: Admit: 2021-02-19 | Discharge: 2021-02-18 | Payer: Commercial Managed Care - Pharmacy Benefit Manager

## 2021-02-19 MED ADMIN — LACTATED RINGERS IV SOLP [4318]: 1000 mL | INTRAVENOUS | @ 08:00:00 | Stop: 2021-02-19 | NDC 00338011704

## 2021-02-19 MED ADMIN — ONDANSETRON HCL (PF) 4 MG/2 ML IJ SOLN [136012]: 4 mg | INTRAVENOUS | @ 08:00:00 | Stop: 2021-02-19 | NDC 72266012301

## 2021-02-19 MED ADMIN — CEFAZOLIN INJ 1GM IVP [210319]: 2 g | INTRAVENOUS | @ 10:00:00 | Stop: 2021-02-19 | NDC 60505614200

## 2021-02-19 MED ADMIN — WATER FOR INJECTION, STERILE IJ SOLN [79513]: 20 mL | INTRAVENOUS | @ 10:00:00 | Stop: 2021-02-19 | NDC 63323018508

## 2021-02-19 MED ADMIN — SODIUM CHLORIDE 0.9 % IJ SOLN [7319]: 50 mL | INTRAVENOUS | @ 09:00:00 | Stop: 2021-02-19 | NDC 00409488820

## 2021-02-19 MED ADMIN — IOHEXOL 350 MG IODINE/ML IV SOLN [81210]: 80 mL | INTRAVENOUS | @ 09:00:00 | Stop: 2021-02-19 | NDC 00407141491

## 2021-02-19 MED FILL — CEPHALEXIN 500 MG PO CAP: 500 mg | ORAL | 7 days supply | Qty: 28 | Fill #1 | Status: CP

## 2021-05-15 ENCOUNTER — Encounter: Admit: 2021-05-15 | Discharge: 2021-05-15

## 2021-05-15 MED ORDER — DIVALPROEX 500 MG PO TB24
ORAL_TABLET | 1 refills
Start: 2021-05-15 — End: ?

## 2021-05-16 ENCOUNTER — Encounter: Admit: 2021-05-16 | Discharge: 2021-05-16

## 2021-05-16 MED ORDER — DIVALPROEX 500 MG PO TB24
500 mg | ORAL_TABLET | Freq: Two times a day (BID) | ORAL | 1 refills | 30.00000 days | Status: AC
Start: 2021-05-16 — End: ?

## 2021-05-28 ENCOUNTER — Encounter: Admit: 2021-05-28 | Discharge: 2021-05-28

## 2021-05-28 DIAGNOSIS — S43215D Anterior dislocation of left sternoclavicular joint, subsequent encounter: Secondary | ICD-10-CM

## 2021-06-29 ENCOUNTER — Encounter: Admit: 2021-06-29 | Discharge: 2021-06-29

## 2022-01-09 ENCOUNTER — Encounter: Admit: 2022-01-09 | Discharge: 2022-01-09

## 2022-01-09 MED ORDER — DIVALPROEX 500 MG PO TB24
500 mg | ORAL_TABLET | Freq: Two times a day (BID) | ORAL | 1 refills | 30.00000 days | Status: AC
Start: 2022-01-09 — End: ?

## 2022-07-10 MED ORDER — DIVALPROEX 500 MG PO TB24
500 mg | ORAL_TABLET | Freq: Two times a day (BID) | ORAL | 0 refills | 30.00000 days | Status: DC
Start: 2022-07-10 — End: 2022-07-11
  Filled 2022-07-11: qty 120, 60d supply, fill #1

## 2022-07-10 MED ORDER — DIVALPROEX 500 MG PO TB24
500 mg | Freq: Once | ORAL | 0 refills | Status: DC
Start: 2022-07-10 — End: 2022-07-10

## 2022-07-10 MED ORDER — DIVALPROEX 500 MG PO TB24
500 mg | ORAL_TABLET | Freq: Two times a day (BID) | ORAL | 0 refills | 30.00000 days | Status: AC
Start: 2022-07-10 — End: ?

## 2022-07-10 MED ORDER — DIVALPROEX 500 MG PO TB24
1000 mg | Freq: Once | ORAL | 0 refills | Status: CP
Start: 2022-07-10 — End: ?
  Administered 2022-07-10: 16:00:00 1000 mg via ORAL

## 2022-07-22 ENCOUNTER — Encounter: Admit: 2022-07-22 | Discharge: 2022-07-22 | Payer: PRIVATE HEALTH INSURANCE

## 2022-08-30 ENCOUNTER — Encounter: Admit: 2022-08-30 | Discharge: 2022-08-30 | Payer: PRIVATE HEALTH INSURANCE

## 2022-08-30 ENCOUNTER — Emergency Department
Admit: 2022-08-30 | Discharge: 2022-08-31 | Disposition: A | Discharge status: Home / Self Care | Payer: PRIVATE HEALTH INSURANCE

## 2022-08-30 DIAGNOSIS — R569 Unspecified convulsions: Secondary | ICD-10-CM

## 2022-08-30 LAB — COMPREHENSIVE METABOLIC PANEL
ALBUMIN: 5.1 g/dL — ABNORMAL HIGH (ref 3.5–5.0)
ALT: 16 U/L (ref 7–56)
AST: 20 U/L (ref 7–40)
CALCIUM: 9.8 mg/dL (ref 8.5–10.6)
CHLORIDE: 101 MMOL/L (ref 98–110)
CO2: 24 MMOL/L (ref 21–30)
CREATININE: 0.7 mg/dL (ref 0.4–1.24)
EGFR: >60 mL/min (ref >60)
GLUCOSE,PANEL: 101 mg/dL — ABNORMAL HIGH (ref 70–100)
POTASSIUM: 3.9 MMOL/L (ref 3.5–5.1)
TOTAL BILIRUBIN: 0.7 mg/dL (ref 0.2–1.3)
TOTAL PROTEIN: 7.8 g/dL (ref 6.0–8.0)

## 2022-08-30 LAB — POC GLUCOSE: POC GLUCOSE: 100 mg/dL (ref 70–100)

## 2022-08-30 LAB — CBC AND DIFF
ABSOLUTE BASO COUNT: 0 K/UL (ref 0–0.20)
ABSOLUTE EOS COUNT: 0 K/UL (ref 0–0.45)
ABSOLUTE LYMPH COUNT: 2.1 K/UL (ref 1.0–4.8)
HEMOGLOBIN: 14 g/dL (ref 13.5–16.5)
MCHC: 34 g/dL (ref 32.0–36.0)
MDW (MONOCYTE DISTRIBUTION WIDTH): 14 (ref <20.7)
MONOCYTES %: 6 % (ref 4–12)
WBC COUNT: 8.8 K/UL (ref 4.5–11.0)

## 2022-08-30 LAB — VALPROIC ACID LEVEL: VALPROIC ACID: 16 ug/mL — ABNORMAL LOW (ref 50.0–100.0)

## 2022-08-30 LAB — MAGNESIUM: MAGNESIUM: 1.7 mg/dL (ref 1.6–2.6)

## 2022-08-30 MED ORDER — DIVALPROEX 500 MG PO TB24
500 mg | Freq: Once | ORAL | 0 refills | Status: CP
Start: 2022-08-30 — End: ?
  Administered 2022-08-31: 01:00:00 500 mg via ORAL

## 2022-08-30 MED ORDER — DIVALPROEX 500 MG PO TB24
500 mg | ORAL_TABLET | Freq: Every day | ORAL | 3 refills | 30.00000 days | Status: AC
Start: 2022-08-30 — End: ?
  Filled 2022-08-31: qty 30, 30d supply, fill #1

## 2022-08-31 NOTE — ED Notes
Pt AOx4 and given discharge instructions and follow up information. Pt verbalizes understanding and has no further questions or complaints. VSS. Pt ambulated out of ED w/steady gait to go home POV picked up by brother.

## 2022-10-09 ENCOUNTER — Encounter: Admit: 2022-10-09 | Discharge: 2022-10-09 | Payer: PRIVATE HEALTH INSURANCE

## 2022-10-10 ENCOUNTER — Encounter: Admit: 2022-10-10 | Discharge: 2022-10-10 | Payer: PRIVATE HEALTH INSURANCE

## 2022-10-10 MED FILL — DIVALPROEX 500 MG PO TB24: 500 mg | ORAL | 30 days supply | Status: CN

## 2022-10-28 ENCOUNTER — Encounter: Admit: 2022-10-28 | Discharge: 2022-10-28 | Payer: PRIVATE HEALTH INSURANCE

## 2022-10-28 ENCOUNTER — Emergency Department
Admit: 2022-10-28 | Discharge: 2022-10-28 | Disposition: A | Discharge status: Home / Self Care | Payer: PRIVATE HEALTH INSURANCE

## 2022-10-28 ENCOUNTER — Emergency Department: Admit: 2022-10-28 | Discharge: 2022-10-28 | Payer: PRIVATE HEALTH INSURANCE

## 2022-10-28 DIAGNOSIS — Z658 Other specified problems related to psychosocial circumstances: Secondary | ICD-10-CM

## 2022-10-28 DIAGNOSIS — Z760 Encounter for issue of repeat prescription: Secondary | ICD-10-CM

## 2022-10-28 DIAGNOSIS — R9431 Abnormal electrocardiogram [ECG] [EKG]: Secondary | ICD-10-CM

## 2022-10-28 DIAGNOSIS — I498 Other specified cardiac arrhythmias: Secondary | ICD-10-CM

## 2022-10-28 DIAGNOSIS — G40919 Epilepsy, unspecified, intractable, without status epilepticus: Secondary | ICD-10-CM

## 2022-10-28 DIAGNOSIS — R569 Unspecified convulsions: Secondary | ICD-10-CM

## 2022-10-28 DIAGNOSIS — R519 Headache, unspecified: Secondary | ICD-10-CM

## 2022-10-28 DIAGNOSIS — I4510 Unspecified right bundle-branch block: Secondary | ICD-10-CM

## 2022-10-28 DIAGNOSIS — Z597 Insufficient social insurance and welfare support: Secondary | ICD-10-CM

## 2022-10-28 DIAGNOSIS — Z789 Other specified health status: Secondary | ICD-10-CM

## 2022-10-28 DIAGNOSIS — R4189 Other symptoms and signs involving cognitive functions and awareness: Secondary | ICD-10-CM

## 2022-10-28 DIAGNOSIS — Z556 Problems related to health literacy: Secondary | ICD-10-CM

## 2022-10-28 LAB — COMPREHENSIVE METABOLIC PANEL
ALBUMIN: 4.7 g/dL (ref 3.5–5.0)
ALK PHOSPHATASE: 36 U/L (ref 25–110)
ALT: 13 U/L (ref 7–56)
ANION GAP: 9 10*3/uL (ref 3–12)
AST: 14 U/L (ref 7–40)
BLD UREA NITROGEN: 14 mg/dL (ref 7–25)
CALCIUM: 9.2 mg/dL (ref 8.5–10.6)
CHLORIDE: 103 MMOL/L — ABNORMAL LOW (ref 98–110)
CO2: 25 MMOL/L (ref 21–30)
CREATININE: 0.6 mg/dL (ref 0.4–1.24)
EGFR: >60 mL/min (ref >60)
GLUCOSE,PANEL: 108 mg/dL — ABNORMAL HIGH (ref 70–100)
SODIUM: 137 MMOL/L — ABNORMAL LOW (ref 137–147)
TOTAL BILIRUBIN: 0.6 mg/dL (ref 0.2–1.3)
TOTAL PROTEIN: 6.7 g/dL (ref 6.0–8.0)

## 2022-10-28 LAB — CBC AND DIFF
ABSOLUTE BASO COUNT: 0 10*3/uL (ref 0–0.20)
ABSOLUTE EOS COUNT: 0 10*3/uL (ref 0–0.45)
ABSOLUTE MONO COUNT: 0.4 10*3/uL (ref 0–0.80)
MDW (MONOCYTE DISTRIBUTION WIDTH): 14 (ref <20.7)
WBC COUNT: 4.9 10*3/uL (ref 4.5–11.0)

## 2022-10-28 LAB — MAGNESIUM: MAGNESIUM: 1.6 mg/dL — ABNORMAL LOW (ref 1.6–2.6)

## 2022-10-28 MED ORDER — ACETAMINOPHEN 500 MG PO TAB
1000 mg | Freq: Once | ORAL | 0 refills | Status: CP
Start: 2022-10-28 — End: ?
  Administered 2022-10-28: 15:00:00 1000 mg via ORAL

## 2022-10-28 MED ORDER — DIVALPROEX 500 MG PO TB24
500 mg | ORAL_TABLET | Freq: Two times a day (BID) | ORAL | 0 refills | 30.00000 days | Status: AC
Start: 2022-10-28 — End: ?

## 2022-10-28 MED ORDER — KETOROLAC 15 MG/ML IJ SOLN
15 mg | Freq: Once | INTRAVENOUS | 0 refills | Status: CP
Start: 2022-10-28 — End: ?
  Administered 2022-10-28: 16:00:00 15 mg via INTRAVENOUS

## 2022-10-28 MED ORDER — LACTATED RINGERS IV SOLP
1000 mL | INTRAVENOUS | 0 refills | Status: CP
Start: 2022-10-28 — End: ?
  Administered 2022-10-28: 16:00:00 1000 mL via INTRAVENOUS

## 2022-10-28 MED ORDER — MAGNESIUM SULFATE IN D5W 1 GRAM/100 ML IV PGBK
1 g | Freq: Once | INTRAVENOUS | 0 refills | Status: CP
Start: 2022-10-28 — End: ?
  Administered 2022-10-28: 16:00:00 1 g via INTRAVENOUS

## 2022-10-28 MED ORDER — DIVALPROEX 500 MG PO TB24
500 mg | Freq: Once | ORAL | 0 refills | Status: CP
Start: 2022-10-28 — End: ?
  Administered 2022-10-28: 15:00:00 500 mg via ORAL

## 2022-10-28 NOTE — Consults
Neurology Consult/H&P Note      Admission Date: 10/28/2022                                                LOS: 0 days    Reason for Consult:  Potential recurrent seizures, recurrent episodes of memory loss consistent with prior seizures. Not returning to baseline   Date of service: 10/28/22     Consult type: Co-Management w/Signed Orders    Assessment: Alexis Le is a 32 y.o. right-handed male with PMH of seizures, admitted 10/28/2022, that neurology is consulted to evaluate for recurrent seizure-like confusion after running out of depakote at home.    Exam: Normal neurologic exam, no focal deficits noted    Labs:  Hgb 13.4, plt 151, WBC 4.9  Na 137, K 4.0, Cr 0.60  LFTs wnl    Imaging:  CT head pending    Impression: 32 yo M with history of prior episodes concerning for seizure, now with episode of confusion after running out of depakote.   - Seizures with post-ictal confusion vs. Non-epileptic spells vs. Other mood related disorder. Sleep deprivation and stress likely playing a role in any of the above etiologies.    Problem List:  Principal Problem:    Disorientation  Active Problems:    Non compliance w medication regimen    Sleep deprivation        Recommendations:  - Restart depakote 500mg  BID  - Check VPA level in 1 week  - Establish care with a PCP as soon as possible so you do not run out of meds again and to get issues of sleep deprivation and stress addressed  - Recommend reducing marijuana use as I suspect current overuse is likely playing a role in his difficulty getting quality sleep  - Re-establish care in epilepsy clinic- referral sent  -  Seizure precautions: no climbing ladders, tub baths, swimming alone, operation of heavy machinery, or driving.     Neurology will sign off, ok to d/c home from neuro standpoint.    Discussed above plan with patient and girlfriend at bedside and with the ED team.    Thank you for allowing [place] to participate in the care of your patient.     Neurology 2 Consult Service:  > I can be contacted for patient questions on Voalte between 8AM and 5PM.  > Urgent questions (prior to 8AM) can otherwise be directed to the Neurology teaching service at pager [phone].      Andrew Au, MD  Clinical Assistant Professor  Department of Neurology    The patient was evaluated for an acute change in neurologic status. I reviewed lab data, personally reviewed prior clinic visits in epilepsy clinic. Case was discussed with girlfriend at bedside and with ED medical team.  ______________________________________________________________________    History of Present Illness: Alexis Le is a 32 y.o. male with PMH of seizures, admitted 10/28/2022, that neurology is consulted to evaluate for recurrent seizure-like confusion after running out of depakote at home. He has been out of his medication for the past 3 days. He reports that he has intermittently taken the depakote, but sometimes does not take it either because he runs out or because he just doesn't like taking pills. He says about a month ago he ran out and started having a lot of  spells of confusion. He went to an outside hospital at that time and got a refill and felt better once he started taking it again. His RX ran out 3 days ago so he has not had any depakote since that time. Today he called his girlfriend and she could tell he sounded very confused and told him to come to ED. He does not recall some events from the past 3 days including bringing his 50 year old son to school today. His son has not reported any seizure like activity. The patient reports he does not sleep well and has a lot of stress. 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 He reports longstanding issue with insomnia, not treated in the past.  Patient was seen in epilepsy clinic in the past, last seen 2 years ago. There were concerns for possible seizures as he was having episode of dysphoria sometimes following by GTC seizure. A work up was ordered but never completed. At some point he lost insurance and was not able to stay on meds, but reports he has insurance again now and wants to re-establish care. He is not aware of having any episodes of seizure like activity. No falls/injuries, b/b incontinence, etc. He says any time stops the depakote he gets confused and his girlfriend confirms this.   The patient did receive depakote 500mg  in ED aobut 3 hours prior to my visit. Girlfriend at bedside feels he is at his neurologic baseline at the time of my visit and the patient agrees, reports he now feels back to normal.     Past Medical History:   Diagnosis Date    Inguinal hernia     Seizure disorder [facility])        Surgical History:   Procedure Laterality Date    OPEN RIGHT INGUINAL HERNIA REPAIR WITH MESH Right 01/09/2018    Performed by Janine Ores, DO at [facility] OR    CLOSED TREATMENT CLAVICULAR FRACTURE Left 11/29/2020    Performed by Heddings, Revonda Standard, MD at [facility] OR       Social History     Tobacco Use    Smoking status: Never    Smokeless tobacco: Never   Substance Use Topics    Alcohol use: Not Currently    Drug use: Yes     Frequency: 7.0 times per week     Types: Marijuana     Comment: Past benzodiazepine abuse; 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       No family history on file.    Allergies:  Patient has no known allergies.    Scheduled Meds:Continuous Infusions:  PRN and Respiratory Meds:    No current facility-administered medications on file prior to encounter.     Current Outpatient Medications on File Prior to Encounter   Medication Sig Dispense Refill    acetaminophen (MAPAP (ACETAMINOPHEN)) 325 mg tablet Take two tablets by mouth every 6 hours as needed.  0    cephalexin (KEFLEX) 500 mg capsule Take one capsule by mouth four times daily. 28 capsule 0    divalproex (DEPAKOTE ER) 500 mg ER tablet Take one tablet by mouth daily for 360 days. Take with food. 90 tablet 3    fluticasone propionate (FLONASE ALLERGY RELIEF) 50 mcg/actuation nasal spray, suspension Apply two sprays to each nostril  as directed daily. Shake bottle gently before using. 48 g 3    hydrocortisone 1 % topical cream Apply  topically to affected area daily. Indications: eczema      loratadine (CLARITIN) 10 mg tablet Take one tablet by mouth every morning. 90 tablet 1    methocarbamoL (ROBAXIN) 500 mg tablet Take two tablets by mouth three times daily. 60 tablet 1    naloxone (NARCAN) 4 mg/actuation nasal spray Insert 1 spray into 1 nostril as needed for signs of opioid overdose then call 911. May repeat dose every 2-3 minutes (alternate nostrils) until medical team arrives.  Indications: opioid overdose, decrease in rate & depth of breathing due to opioid drug 2 each 0       Review of Systems:  All other systems reviewed and are negative.    Vital Signs:  Last Filed in 24 hours Vital Signs:  24 hour Range    BP: 127/65 (08/12 1130)  Temp: 36.7 ?C (98 ?F) (08/12 0926)  Pulse: 58 (08/12 1130)  Respirations: 12 PER MINUTE (08/12 1130)  SpO2: 95 % (08/12 1130)  O2 Device: None (Room air) (08/12 0926) BP: (110-127)/(65-87)   Temp:  [36.7 ?C (98 ?F)]   Pulse:  [48-74]   Respirations:  [9 PER MINUTE-18 PER MINUTE]   SpO2:  [95 %-99 %]   O2 Device: None (Room air)       General physical exam:    HEENT: normocephalic, eyes open with no discharge, nares patent, oropharynx is clear with no lesions, palate intact  Skin: no rashes or lesions    Neuro exam:   Mental status: Awake, alert, and oriented to person, place, time and situation, follows commands  Memory: Recent and remote memory intact  Attention: Good span, follows conversation.     Speech:    Normal Abnormal   Fluency x    Comprehension x    Articulation x      Cranial Nerves:    Normal Abnormal   II Pupils equal (3mm), round, reactive. VFF.    III, IV, VI EOMI, no nystagmus    V Intact to LT in V1-3    VII Face symmetrical, eye closure symmetrical    VIII Hearing intact to finger rub    IX, X Uvula midline and palate elevation symmetrical    XI Shrug equal     XII Tongue midline        Muscle/motor:   Tone: nml  Bulk: nml  Fasciculations: none  Pronator drift: absent       Right  Left   Right  Left    Shoulder abductors:  5  5 Hip flexors:  5  5   Elbow flexors:  5  5 Hip abductors:      Elbow extensors:  5  5 Hip extensors:      Wrist extensors:    Hip adductors:      Wrist flexors:    Knee flexors:  5  5   Finger flexors:  5  5 Knee extensors:  5  5   Finger extensors:    Ankle plantar flexors:  5  5   Finger abductors:    Ankle dorsiflexors:  5  5   Thumb abductors:    Ankle inversion:         Ankle eversion:         Toe extensors:         Toe flexors:      No abnormal movements, rigidity  or spasticity    Reflexes:      Right Left   Triceps 2 2   Biceps 2 2   Brachioradialis 2 2   Patella 2 2   Ankle 2 2   Plantar downgoing downgoing       Sensation:    Normal RUE LUE RLE LLE   Light Touch x           Coordination:    Normal Abnormal Right Abnormal Left   Finger to Nose x     Rapid alternating       Heel to Shin x     Finger tap x     Foot tap        Gait and Station:  deferred      Lab/Radiology/Other Diagnostic Tests:  24-hour labs:    Results for orders placed or performed during the hospital encounter of 10/28/22 (from the past 24 hour(s))   CBC AND DIFF    Collection Time: 10/28/22  9:47 AM   Result Value Ref Range    White Blood Cells 4.9 4.5 - 11.0 K/UL    RBC 4.25 (L) 4.4 - 5.5 M/UL    Hemoglobin 13.4 (L) 13.5 - 16.5 GM/DL    Hematocrit 16.1 (L) 40 - 50 %    MCV 92.1 80 - 100 FL    MCH 31.5 26 - 34 PG    MCHC 34.2 32.0 - 36.0 G/DL    RDW 09.6 11 - 15 %    Platelet Count 151 150 - 400 K/UL    MPV 9.7 7 - 11 FL    Neutrophils 61 41 - 77 %    Lymphocytes 30 24 - 44 %    Monocytes 8 4 - 12 %    Eosinophils 0 0 - 5 %    Basophils 1 0 - 2 %    Absolute Neutrophil Count 3.00 1.8 - 7.0 K/UL Absolute Lymph Count 1.46 1.0 - 4.8 K/UL    Absolute Monocyte Count 0.40 0 - 0.80 K/UL    Absolute Eosinophil Count 0.02 0 - 0.45 K/UL    Absolute Basophil Count 0.03 0 - 0.20 K/UL    MDW (Monocyte Distribution Width) 14.6 <20.7   COMPREHENSIVE METABOLIC PANEL    Collection Time: 10/28/22  9:47 AM   Result Value Ref Range    Sodium 137 137 - 147 MMOL/L    Potassium 4.0 3.5 - 5.1 MMOL/L    Chloride 103 98 - 110 MMOL/L    Glucose 108 (H) 70 - 100 MG/DL    Blood Urea Nitrogen 14 7 - 25 MG/DL    Creatinine 0.45 0.4 - 1.24 MG/DL    Calcium 9.2 8.5 - 40.9 MG/DL    Total Protein 6.7 6.0 - 8.0 G/DL    Total Bilirubin 0.6 0.2 - 1.3 MG/DL    Albumin 4.7 3.5 - 5.0 G/DL    Alk Phosphatase 36 25 - 110 U/L    AST (SGOT) 14 7 - 40 U/L    CO2 25 21 - 30 MMOL/L    ALT (SGPT) 13 7 - 56 U/L    Anion Gap 9 3 - 12    eGFR >60 >60 mL/min   MAGNESIUM    Collection Time: 10/28/22  9:47 AM   Result Value Ref Range    Magnesium 1.6 1.6 - 2.6 mg/dL             No pertinent radiology.

## 2022-10-28 NOTE — ED Notes
32 y.o. male presents to ED 69 with a CC of seizures with onset of confusion and a severe headache. He is unaware of how many seizures he had or the duration of seizures, but states he know he had multiple throughout the night. He is currently prescribed to Depakote, but states he has not been taking it for the past 2 days due to insurance problems. Pt states he had multiple seizures throughout the night and took his son to school, but has no memory of taking his son and proceeded to call the child's mother asking where his son is. From that event of confusion following the seizures, he drove himself to the ER to be evaluated and get his prescription refilled. Pt has no numbness or tingling, sensation intact, lung sounds clear, no edema, pupils equal round and reactive to light, afebrile. No seizure activity noted at this time. Pt resting comfortably in cart, respirations even and unlabored, vital signs stable, bed locked, in lowest position, call light within reach. Provider at bedside.     Past Medical History:   Diagnosis Date    Inguinal hernia     Seizure disorder (HCC)

## 2022-12-02 ENCOUNTER — Encounter: Admit: 2022-12-02 | Discharge: 2022-12-02 | Payer: PRIVATE HEALTH INSURANCE

## 2023-01-01 ENCOUNTER — Emergency Department
Admit: 2023-01-01 | Discharge: 2023-01-01 | Disposition: A | Discharge status: Home / Self Care | Payer: PRIVATE HEALTH INSURANCE

## 2023-01-01 DIAGNOSIS — G40919 Epilepsy, unspecified, intractable, without status epilepticus: Secondary | ICD-10-CM

## 2023-01-01 LAB — COMPREHENSIVE METABOLIC PANEL
POTASSIUM: 3.7 MMOL/L — ABNORMAL LOW (ref 3.5–5.1)
SODIUM: 137 MMOL/L — ABNORMAL LOW (ref 137–147)

## 2023-01-01 LAB — CBC AND DIFF
ABSOLUTE BASO COUNT: 0 10*3/uL (ref 0–0.20)
ABSOLUTE EOS COUNT: 0 10*3/uL (ref 0–0.45)
ABSOLUTE MONO COUNT: 0.4 10*3/uL (ref 0–0.80)
MDW (MONOCYTE DISTRIBUTION WIDTH): 14 (ref <20.7)
WBC COUNT: 5.9 10*3/uL (ref 4.5–11.0)

## 2023-01-01 LAB — URINALYSIS MICROSCOPIC REFLEX TO CULTURE

## 2023-01-01 LAB — URINALYSIS DIPSTICK REFLEX TO CULTURE
LEUKOCYTES: NEGATIVE 10*3/uL (ref 3–12)
NITRITE: NEGATIVE U/L (ref 7–56)
URINE ASCORBIC ACID, UA: NEGATIVE mL/min (ref 1.0–4.8)
URINE BILE: NEGATIVE U/L (ref 25–110)
URINE BLOOD: NEGATIVE U/L (ref 7–40)

## 2023-01-01 LAB — VALPROIC ACID LEVEL: VALPROIC ACID: 34 ug/mL — ABNORMAL LOW (ref 50.0–100.0)

## 2023-01-01 LAB — MAGNESIUM: MAGNESIUM: 1.7 mg/dL (ref 1.6–2.6)

## 2023-01-01 MED ORDER — DIVALPROEX 500 MG PO TB24
500 mg | Freq: Once | ORAL | 0 refills | Status: CP
Start: 2023-01-01 — End: ?
  Administered 2023-01-01: 16:00:00 500 mg via ORAL

## 2023-01-01 MED ORDER — DIPHENHYDRAMINE HCL 25 MG PO CAP
25 mg | Freq: Once | ORAL | 0 refills | Status: CP
Start: 2023-01-01 — End: ?
  Administered 2023-01-01: 16:00:00 25 mg via ORAL

## 2023-01-01 MED ORDER — METOCLOPRAMIDE HCL 5 MG/ML IJ SOLN
10 mg | Freq: Once | INTRAVENOUS | 0 refills | Status: CP
Start: 2023-01-01 — End: ?
  Administered 2023-01-01: 16:00:00 10 mg via INTRAVENOUS

## 2023-01-01 MED ORDER — DIVALPROEX 500 MG PO TB24
500 mg | ORAL_TABLET | Freq: Every day | ORAL | 1 refills | 30.00000 days | Status: AC
Start: 2023-01-01 — End: ?

## 2023-01-01 MED ORDER — KETOROLAC 15 MG/ML IJ SOLN
15 mg | Freq: Once | INTRAVENOUS | 0 refills | Status: CP
Start: 2023-01-01 — End: ?
  Administered 2023-01-01: 15:00:00 15 mg via INTRAVENOUS

## 2023-01-01 MED ORDER — DIVALPROEX 250 MG PO TBEC
500 mg | Freq: Once | ORAL | 0 refills | Status: DC
Start: 2023-01-01 — End: 2023-01-01

## 2023-01-01 MED ORDER — ACETAMINOPHEN 500 MG PO TAB
1000 mg | Freq: Once | ORAL | 0 refills | Status: CP
Start: 2023-01-01 — End: ?
  Administered 2023-01-01: 16:00:00 1000 mg via ORAL

## 2023-01-01 NOTE — ED Notes
Discharge instructions and prescriptions discussed with Pt. Pt verbalized understanding to follow up and come to ED if symptoms worsen. No further questions or concerns at this time. Pt alert & oriented. Pt ambulated with steady gait out of department with all of Pt belongings. IV removed.

## 2023-01-01 NOTE — ED Provider Notes
Alexis Le is a 32 y.o. male.    Chief Complaint:  Chief Complaint   Patient presents with    Seizure     Seizures the last couple days, last yesterday. Depakote- Intermittently taking. Pt does not remember episodes, mentioned video where he shakes with seizures. Denies aura.       History of Present Illness:    Seizure      Patient is a 32 year old male with a past medical history of seizure disorder who presents to the ER following a suspected seizure.  Patient does not recall the incident.  However, he states that his 29 year old son he was shaking in his sleep overnight.  Has a history of seizure disorder.  Unsure what type of seizure he has.  Is supposed to take Depakote but states he only takes it occasionally as he does not always have time to eat when he takes the medication.  His last seizure was in August of this year.  Reports a headache but no other symptoms.    Review of Systems:  Review of Systems   Constitutional:  Negative for chills, fatigue and fever.   HENT:  Negative for congestion and sore throat.    Eyes:  Negative for visual disturbance.   Respiratory:  Negative for cough and shortness of breath.    Cardiovascular:  Negative for chest pain and palpitations.   Gastrointestinal:  Negative for abdominal pain, constipation, diarrhea, nausea and vomiting.   Genitourinary:  Negative for dysuria and flank pain.   Musculoskeletal:  Negative for back pain and myalgias.   Skin:  Negative for rash.   Neurological:  Positive for seizures and headaches. Negative for dizziness, weakness, light-headedness and numbness.       Allergies:  Patient has no known allergies.    Past Medical History:  Past Medical History:   Diagnosis Date    Inguinal hernia     Seizure disorder [facility])        Past Surgical History:  Surgical History:   Procedure Laterality Date    OPEN RIGHT INGUINAL HERNIA REPAIR WITH MESH Right 01/09/2018    Performed by Janine Ores, DO at [facility] OR    CLOSED TREATMENT CLAVICULAR FRACTURE Left 11/29/2020    Performed by Heddings, Revonda Standard, MD at [facility] OR       Past Medical History:   Diagnosis Date    Inguinal hernia     Seizure disorder [facility])      Surgical History:   Procedure Laterality Date    OPEN RIGHT INGUINAL HERNIA REPAIR WITH MESH Right 01/09/2018    Performed by Janine Ores, DO at [facility] OR    CLOSED TREATMENT CLAVICULAR FRACTURE Left 11/29/2020    Performed by Heddings, Revonda Standard, MD at [facility] OR       Social History:  Social History     Tobacco Use    Smoking status: Never    Smokeless tobacco: Never   Substance Use Topics    Alcohol use: Not Currently    Drug use: Yes     Frequency: 7.0 times per week     Types: Marijuana     Comment: Past benzodiazepine abuse; 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  Family History:  No family history on file.    Vitals:  ED Vitals      Date and Time T BP P RR SPO2P SPO2 User   01/01/23 1130 -- 139/80 59 18 PER MINUTE -- 95 % JR   01/01/23 1100 -- 115/66 73 19 PER MINUTE -- 96 % JR   01/01/23 1030 -- 128/71 46 16 PER MINUTE -- 95 % JR   01/01/23 1000 -- 122/60 54 14 PER MINUTE -- 94 % JR   01/01/23 0945 -- 117/75 71 16 PER MINUTE -- 96 % JR   01/01/23 0900 -- 117/75 59 13 PER MINUTE -- 97 % JR   01/01/23 0848 36.5 ?C (97.7 ?F) 132/76 60 21 PER MINUTE -- 97 % JR            Physical Exam:  Physical Exam  Vitals and nursing note reviewed.   Constitutional:       Appearance: Normal appearance. He is not ill-appearing or diaphoretic.   HENT:      Head: Normocephalic and atraumatic.      Nose: Nose normal.      Mouth/Throat:      Mouth: Mucous membranes are moist.   Eyes:      Extraocular Movements: Extraocular movements intact.      Conjunctiva/sclera: Conjunctivae normal.   Cardiovascular:      Rate and Rhythm: Normal rate and regular rhythm.   Pulmonary:      Effort: Pulmonary effort is normal.      Breath sounds: Normal breath sounds.   Abdominal:      General: Bowel sounds are normal. There is no distension.      Palpations: Abdomen is soft.      Tenderness: There is no abdominal tenderness. There is no right CVA tenderness or left CVA tenderness.   Musculoskeletal:         General: Normal range of motion.      Cervical back: Normal range of motion and neck supple.      Right lower leg: No edema.      Left lower leg: No edema.   Skin:     General: Skin is warm and dry.   Neurological:      General: No focal deficit present.      Mental Status: He is oriented to person, place, and time. Mental status is at baseline.      Cranial Nerves: Cranial nerves 2-12 are intact.      Sensory: Sensation is intact.      Motor: Motor function is intact. No seizure activity.      Coordination: Coordination is intact.      Gait: Gait is intact.   Psychiatric:         Mood and Affect: Mood normal.         Behavior: Behavior normal.         Laboratory Results:  Labs Reviewed   CBC AND DIFF - Abnormal       Result Value Ref Range Status    White Blood Cells 5.9  4.5 - 11.0 K/UL Final    RBC 4.23 (*) 4.4 - 5.5 M/UL Final    Hemoglobin 13.3 (*) 13.5 - 16.5 GM/DL Final    Hematocrit 40.9 (*) 40 - 50 % Final    MCV 91.3  80 - 100 FL Final    MCH 31.4  26 - 34 PG Final    MCHC 34.4  32.0 - 36.0 G/DL Final  RDW 12.8  11 - 15 % Final    Platelet Count 145 (*) 150 - 400 K/UL Final    MPV 9.8  7 - 11 FL Final    Neutrophils 66  41 - 77 % Final    Lymphocytes 26  24 - 44 % Final    Monocytes 7  4 - 12 % Final    Eosinophils 0  0 - 5 % Final    Basophils 1  0 - 2 % Final    Absolute Neutrophil Count 3.89  1.8 - 7.0 K/UL Final    Absolute Lymph Count 1.52  1.0 - 4.8 K/UL Final    Absolute Monocyte Count 0.43  0 - 0.80 K/UL Final    Absolute Eosinophil Count 0.02  0 - 0.45 K/UL Final    Absolute Basophil Count 0.03  0 - 0.20 K/UL Final    MDW (Monocyte Distribution Width) 14.2  <20.7 Final   VALPROIC ACID LEVEL - Abnormal    Valproic Acid 34.0 (*) 50.0 - 100.0 MCG/ML Final   URINALYSIS DIPSTICK REFLEX TO CULTURE - Abnormal    Color,UA YELLOW   Final    Turbidity,UA CLEAR  CLEAR-CLEAR Final    Specific Gravity-Urine 1.025  1.005 - 1.030 Final    pH,UA 7.0  5.0 - 8.0 Final    Protein,UA 1+ (*) NEG-NEG Final    Glucose,UA NEG  NEG-NEG Final    Ketones,UA 1+ (*) NEG-NEG Final    Bilirubin,UA NEG  NEG-NEG Final    Blood,UA NEG  NEG-NEG Final    Urobilinogen,UA NORMAL  NORM-NORMAL Final    Nitrite,UA NEG  NEG-NEG Final    Leukocytes,UA NEG  NEG-NEG Final    Urine Ascorbic Acid, UA NEG  NEG-NEG Final   COMPREHENSIVE METABOLIC PANEL    Sodium 137  161 - 147 MMOL/L Final    Potassium 3.7  3.5 - 5.1 MMOL/L Final    Chloride 103  98 - 110 MMOL/L Final    Glucose 100  70 - 100 MG/DL Final    Blood Urea Nitrogen 14  7 - 25 MG/DL Final    Creatinine 0.96  0.4 - 1.24 MG/DL Final    Calcium 8.7  8.5 - 10.6 MG/DL Final    Total Protein 6.7  6.0 - 8.0 G/DL Final    Total Bilirubin 0.7  0.2 - 1.3 MG/DL Final    Albumin 4.5  3.5 - 5.0 G/DL Final    Alk Phosphatase 33  25 - 110 U/L Final    AST (SGOT) 16  7 - 40 U/L Final    CO2 23  21 - 30 MMOL/L Final    ALT (SGPT) 15  7 - 56 U/L Final    Anion Gap 11  3 - 12 Final    eGFR >60  >60 mL/min Final   URINALYSIS MICROSCOPIC REFLEX TO CULTURE    WBCs,UA 0-2  0 - 2 /HPF Final    RBCs,UA 0-2  0 - 3 /HPF Final    Comment,UA     Final    Value: Criteria for reflex to culture are WBC>10, Positive Nitrite, and/or >=+1   leukocytes. If quantity is not sufficient, an addendum will follow.      UA Reflex Specimen Type and Source     Final    Value: URINE  MIDSTREAM      MucousUA 1+   Final    Squamous Epithelial Cells 0-2  0 - 5 Final  MAGNESIUM    Magnesium 1.7  1.6 - 2.6 mg/dL Final   UA GREY TOP TUBE   CLEAR TOP EXTRA URINE TUBE          Radiology Interpretation:  No orders to display       EKG:  ECG Results              [facility] ED MAIN ECG TRIAGE ONLY (In process)        Collection Time Result Time VT RATE P-R Interval QRS DURATION Q-T Interval QTC Calc Bazett    01/01/23 08:49:59 01/01/23 08:50:13 67 168 114 394 416             Collection Time Result Time P Axis R Axis T Axis    01/01/23 08:49:59 01/01/23 08:50:13 67 70 37                     In process                   Impression:    Sinus rhythm with premature atrial complexes  Incomplete right bundle branch block  Borderline ECG  When compared with ECG of 28-Oct-2022 09:56,  premature atrial complexes are now present                                    Medical Decision Making:  Rushi Zingg is a 32 y.o. male who presents with chief complaint as listed above. Based on the history and presentation, the list of differential diagnoses considered included, but was not limited to, breakthrough seizure, medication nonadherence, dehydration, electrolyte derangement.    ED Course  32 year old male presenting to the ER with a seizure overnight.  He is afebrile, vitals are stable.  Alert and oriented x 3 at this time.  He does not appear to be postictal at this time.  Logic exam is grossly normal.  He has a regular rate and rhythm, clear breath sounds.  Will plan for basic labs, Depakote level, dose of Depakote and Toradol for his headache.  Will we will plan for period of observation in the ED.  If he remains seizure-free he can be discharged with instructions to continue his home Depakote as prescribed.  ED Course as of 01/01/23 1152   Wed Jan 01, 2023   1043 Valproic Acid(!): 34.0 [AG]   1050 Patient continues to have HA after toradol, will administer remainder of migraine cocktail and some tylenol and reassess.  [AG]   1146 Patient has remained seizure-free throughout visit in the ER.  He is alert and oriented x 3.  Feeling better after medications.  Will plan to discharge with instructions to take his Depakote and follow-up with his primary care doctors. [AG]      ED Course User Index  [AG] Varney Daily, PA-C       Complexity of Problems Addressed  Patient's active diagnoses as well as contributing pre-existing medical problems include:  Clinical Impression   Breakthrough seizure (HCC)          Additional data reviewed:    History was obtained from an independent historian: Not in addition to what is mentioned above  Prior non-ED notes reviewed: Not in addition to what is mentioned above  Independent interpretation of diagnostic tests was performed by me: Not in addition to what is mentioned above  Patient presentation/management was discussed with the following  qualified health care professionals and/or other relevant professionals: Not in addition to what is mentioned above    Risk evaluation:    Diagnosis or treatment of patient condition impacted by social determinant of health: None  Tests Considered but not performed due to clinical scoring (if not mentioned in ED course, aside from what is implied by clinical scores listed):   Rationale regarding whether admission or escalation of care considered if not performed (if not mentioned in ED course, aside from what is implied by clinical scores listed):     ED Scoring:                                Facility Administered Meds:  Medications   ketorolac (TORADOL) injection 15 mg (15 mg Intravenous Given 01/01/23 1016)   divalproex (DEPAKOTE ER) ER tablet 500 mg (500 mg Oral Given 01/01/23 1040)   diphenhydrAMINE HCL (BENADRYL) capsule 25 mg (25 mg Oral Given 01/01/23 1117)   metoclopramide hcl (REGLAN) injection 10 mg (10 mg Intravenous Given 01/01/23 1117)   acetaminophen (TYLENOL EXTRA STRENGTH) tablet 1,000 mg (1,000 mg Oral Given 01/01/23 1117)       Clinical Impression:  Clinical Impression   Breakthrough seizure [facility])       Disposition/Follow up  ED Disposition     ED Disposition   [place]  [address] [address] [place] [phone]  [phone]    Schedule an appointment as soon as possible for a visit in 1 week        Medications:  New Prescriptions    DIVALPROEX (DEPAKOTE ER) 500 MG ER TABLET    Take one tablet by mouth daily. Take with food.       Procedure Notes:  Procedures       Attestation / Supervision:  The service was provided by the APP alone with immediate availability of a physician in the ED.      Varney Daily, PA-C

## 2023-01-10 ENCOUNTER — Encounter: Admit: 2023-01-10 | Discharge: 2023-01-10 | Payer: PRIVATE HEALTH INSURANCE

## 2023-01-10 MED ORDER — DIVALPROEX 500 MG PO TB24
500 mg | ORAL_TABLET | Freq: Every day | ORAL | 0 refills | 30.00000 days | Status: AC
Start: 2023-01-10 — End: ?

## 2023-01-10 NOTE — Telephone Encounter
01/10/23 Pt calling for depakote refill . Pt was recently seen in the ED and prescription was sent to [facility] . [facility].    Depakote ER 500 mg 1 po daily # 90 / 0 R  [facility] [address]      Patient last saw provider on 11/27/20- [facility]. Patient is scheduled for follow up appointment on 01/13/23. Per ED note dated  01/01/23  .  DIVALPROEX (DEPAKOTE ER) 500 MG ER TABLET    Take one tablet by mouth daily. Take with food.     Appt scheduled for 01/13/23 10:30 am Grayce Sessions.    Seizure 01/01/23. Last seizure prior was 11/05/22.  History non compliance.  10/28/22 CT head - images in PACS  No recent MRI   No EEG  Past ASM's - none

## 2023-01-13 ENCOUNTER — Ambulatory Visit: Admit: 2023-01-13 | Discharge: 2023-01-14 | Payer: PRIVATE HEALTH INSURANCE

## 2023-01-13 ENCOUNTER — Encounter: Admit: 2023-01-13 | Discharge: 2023-01-13 | Payer: PRIVATE HEALTH INSURANCE

## 2023-02-24 ENCOUNTER — Encounter: Admit: 2023-02-24 | Discharge: 2023-02-24 | Payer: PRIVATE HEALTH INSURANCE

## 2023-02-24 ENCOUNTER — Ambulatory Visit: Admit: 2023-02-24 | Discharge: 2023-02-25 | Payer: PRIVATE HEALTH INSURANCE

## 2023-02-25 ENCOUNTER — Encounter: Admit: 2023-02-25 | Discharge: 2023-02-25 | Payer: PRIVATE HEALTH INSURANCE

## 2023-03-03 ENCOUNTER — Encounter: Admit: 2023-03-03 | Discharge: 2023-03-03 | Payer: PRIVATE HEALTH INSURANCE

## 2023-03-16 ENCOUNTER — Encounter: Admit: 2023-03-16 | Discharge: 2023-03-16 | Payer: PRIVATE HEALTH INSURANCE

## 2023-03-16 ENCOUNTER — Ambulatory Visit: Admit: 2023-03-16 | Discharge: 2023-03-17 | Payer: PRIVATE HEALTH INSURANCE

## 2023-03-17 ENCOUNTER — Encounter: Admit: 2023-03-17 | Discharge: 2023-03-17 | Payer: PRIVATE HEALTH INSURANCE

## 2023-03-17 DIAGNOSIS — R569 Unspecified convulsions: Secondary | ICD-10-CM

## 2023-03-18 ENCOUNTER — Encounter: Admit: 2023-03-18 | Discharge: 2023-03-18 | Payer: PRIVATE HEALTH INSURANCE

## 2023-03-21 ENCOUNTER — Encounter: Admit: 2023-03-21 | Discharge: 2023-03-21 | Payer: PRIVATE HEALTH INSURANCE

## 2023-03-25 ENCOUNTER — Encounter: Admit: 2023-03-25 | Discharge: 2023-03-25 | Payer: PRIVATE HEALTH INSURANCE

## 2023-04-09 ENCOUNTER — Encounter: Admit: 2023-04-09 | Discharge: 2023-04-09 | Payer: PRIVATE HEALTH INSURANCE

## 2023-04-11 ENCOUNTER — Encounter: Admit: 2023-04-11 | Discharge: 2023-04-11 | Payer: PRIVATE HEALTH INSURANCE

## 2023-04-16 ENCOUNTER — Encounter: Admit: 2023-04-16 | Discharge: 2023-04-16 | Payer: PRIVATE HEALTH INSURANCE

## 2023-06-19 ENCOUNTER — Encounter: Admit: 2023-06-19 | Discharge: 2023-06-19 | Payer: PRIVATE HEALTH INSURANCE

## 2023-06-19 ENCOUNTER — Encounter: Admit: 2023-06-19 | Discharge: 2023-06-19

## 2023-06-19 ENCOUNTER — Ambulatory Visit: Admit: 2023-06-19 | Discharge: 2023-06-20

## 2023-06-19 DIAGNOSIS — F419 Anxiety disorder, unspecified: Secondary | ICD-10-CM

## 2023-06-19 DIAGNOSIS — G40919 Epilepsy, unspecified, intractable, without status epilepticus: Secondary | ICD-10-CM

## 2023-06-19 DIAGNOSIS — F1910 Other psychoactive substance abuse, uncomplicated: Secondary | ICD-10-CM

## 2023-06-19 DIAGNOSIS — F13939 Sedative, hypnotic or anxiolytic use, unspecified with withdrawal, unspecified: Secondary | ICD-10-CM

## 2023-06-19 DIAGNOSIS — F1210 Cannabis abuse, uncomplicated: Secondary | ICD-10-CM

## 2023-06-19 NOTE — Patient Instructions
 It was a pleasure seeing you today. If you have any questions or concerns, please contact me via MyChart or call the psychiatry clinic at [phone].
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
## 2023-07-06 ENCOUNTER — Encounter: Admit: 2023-07-06 | Discharge: 2023-07-06 | Payer: PRIVATE HEALTH INSURANCE

## 2023-10-17 ENCOUNTER — Encounter: Admit: 2023-10-17 | Discharge: 2023-10-17 | Payer: PRIVATE HEALTH INSURANCE

## 2023-10-19 ENCOUNTER — Encounter: Admit: 2023-10-19 | Discharge: 2023-10-19 | Payer: PRIVATE HEALTH INSURANCE

## 2024-04-07 ENCOUNTER — Encounter: Admit: 2024-04-07 | Discharge: 2024-04-07 | Payer: PRIVATE HEALTH INSURANCE

## 2024-04-07 MED ORDER — ESCITALOPRAM OXALATE 5 MG PO TAB
5 mg | ORAL_TABLET | Freq: Every day | ORAL | 3 refills | 30.00000 days | Status: AC
Start: 2024-04-07 — End: ?

## 2024-04-09 ENCOUNTER — Encounter: Admit: 2024-04-09 | Discharge: 2024-04-09 | Payer: PRIVATE HEALTH INSURANCE

## 2024-04-09 MED ORDER — DIVALPROEX 250 MG PO TB24
750 mg | ORAL_TABLET | Freq: Every day | ORAL | 5 refills | 30.00000 days | Status: AC
Start: 2024-04-09 — End: ?

## 2024-04-09 NOTE — Telephone Encounter [36]
 04/09/24  Pt states he went to the pharmacy to pick up Depakote  and they told him it would cost $ 160 .   It has never been that expensive before.  Pt states he thinks his insurance has lapsed and renews in February? He is unsure. He thinks it is Physiological scientist.  Pt is familiar with [facility] . Pt states he has the [facility] [employer] app on his phone.  Pt states he is taking Divalproex  250 mg TID   We reviewed he can take 3 tabs at once daily to help with compliance  [facility] for 90 tabs is $36.99  Pt states he can afford that.    Particia Goo, RN
